# Patient Record
Sex: Male | Born: 1960 | Race: White | Hispanic: No | Marital: Married | Smoking: Former smoker
Health system: Southern US, Community
[De-identification: ages and names within clinical notes are randomized; demographics above are authoritative.]

## PROBLEM LIST (undated history)

## (undated) DIAGNOSIS — F329 Major depressive disorder, single episode, unspecified: Secondary | ICD-10-CM

## (undated) DIAGNOSIS — J309 Allergic rhinitis, unspecified: Secondary | ICD-10-CM

## (undated) DIAGNOSIS — G4730 Sleep apnea, unspecified: Secondary | ICD-10-CM

## (undated) DIAGNOSIS — M1990 Unspecified osteoarthritis, unspecified site: Secondary | ICD-10-CM

## (undated) DIAGNOSIS — K219 Gastro-esophageal reflux disease without esophagitis: Secondary | ICD-10-CM

## (undated) DIAGNOSIS — F32A Depression, unspecified: Secondary | ICD-10-CM

## (undated) DIAGNOSIS — I10 Essential (primary) hypertension: Secondary | ICD-10-CM

## (undated) DIAGNOSIS — F419 Anxiety disorder, unspecified: Secondary | ICD-10-CM

## (undated) DIAGNOSIS — E669 Obesity, unspecified: Secondary | ICD-10-CM

## (undated) DIAGNOSIS — I77810 Thoracic aortic ectasia: Principal | ICD-10-CM

## (undated) HISTORY — DX: Unspecified osteoarthritis, unspecified site: M19.90

## (undated) HISTORY — DX: Anxiety disorder, unspecified: F41.9

## (undated) HISTORY — DX: Obesity, unspecified: E66.9

## (undated) HISTORY — DX: Major depressive disorder, single episode, unspecified: F32.9

## (undated) HISTORY — DX: Depression, unspecified: F32.A

## (undated) HISTORY — DX: Sleep apnea, unspecified: G47.30

## (undated) HISTORY — DX: Thoracic aortic ectasia: I77.810

## (undated) HISTORY — PX: COLONOSCOPY: SHX174

## (undated) HISTORY — DX: Gastro-esophageal reflux disease without esophagitis: K21.9

## (undated) HISTORY — PX: HERNIA REPAIR: SHX51

## (undated) HISTORY — DX: Allergic rhinitis, unspecified: J30.9

---

## 2009-03-22 ENCOUNTER — Ambulatory Visit: Payer: Self-pay | Admitting: Otolaryngology

## 2012-12-20 ENCOUNTER — Ambulatory Visit: Payer: Self-pay | Admitting: Unknown Physician Specialty

## 2016-09-01 ENCOUNTER — Other Ambulatory Visit: Payer: Self-pay | Admitting: Internal Medicine

## 2016-09-01 DIAGNOSIS — I77810 Thoracic aortic ectasia: Secondary | ICD-10-CM

## 2016-09-11 ENCOUNTER — Ambulatory Visit
Admission: RE | Admit: 2016-09-11 | Discharge: 2016-09-11 | Disposition: A | Discharge status: Home / Self Care | Payer: BLUE CROSS/BLUE SHIELD | Source: Ambulatory Visit | Attending: Internal Medicine | Admitting: Internal Medicine

## 2016-09-11 ENCOUNTER — Other Ambulatory Visit: Payer: Self-pay | Admitting: Nurse Practitioner

## 2016-09-11 DIAGNOSIS — K5730 Diverticulosis of large intestine without perforation or abscess without bleeding: Secondary | ICD-10-CM | POA: Insufficient documentation

## 2016-09-11 DIAGNOSIS — I2510 Atherosclerotic heart disease of native coronary artery without angina pectoris: Secondary | ICD-10-CM | POA: Insufficient documentation

## 2016-09-11 DIAGNOSIS — I77810 Thoracic aortic ectasia: Secondary | ICD-10-CM | POA: Diagnosis present

## 2016-09-11 DIAGNOSIS — R1011 Right upper quadrant pain: Secondary | ICD-10-CM

## 2016-09-11 DIAGNOSIS — R12 Heartburn: Secondary | ICD-10-CM

## 2016-09-11 HISTORY — DX: Essential (primary) hypertension: I10

## 2016-09-11 MED ORDER — IOPAMIDOL (ISOVUE-370) INJECTION 76%
100.0000 mL | Freq: Once | INTRAVENOUS | Status: AC | PRN
  Administered 2016-09-11: 100 mL via INTRAVENOUS

## 2016-09-16 ENCOUNTER — Ambulatory Visit
Admission: RE | Admit: 2016-09-16 | Discharge: 2016-09-16 | Disposition: A | Discharge status: Home / Self Care | Payer: BLUE CROSS/BLUE SHIELD | Source: Ambulatory Visit | Attending: Nurse Practitioner | Admitting: Nurse Practitioner

## 2016-09-16 DIAGNOSIS — Z86010 Personal history of colonic polyps: Secondary | ICD-10-CM | POA: Diagnosis not present

## 2016-09-16 DIAGNOSIS — K760 Fatty (change of) liver, not elsewhere classified: Secondary | ICD-10-CM | POA: Insufficient documentation

## 2016-09-16 DIAGNOSIS — R12 Heartburn: Secondary | ICD-10-CM | POA: Diagnosis present

## 2016-09-16 DIAGNOSIS — R1011 Right upper quadrant pain: Secondary | ICD-10-CM | POA: Insufficient documentation

## 2016-09-17 ENCOUNTER — Other Ambulatory Visit: Payer: Self-pay | Admitting: Nurse Practitioner

## 2016-09-17 DIAGNOSIS — R1011 Right upper quadrant pain: Secondary | ICD-10-CM

## 2016-09-17 DIAGNOSIS — R12 Heartburn: Secondary | ICD-10-CM

## 2016-10-08 ENCOUNTER — Other Ambulatory Visit: Payer: Self-pay

## 2016-10-08 ENCOUNTER — Institutional Professional Consult (permissible substitution) (INDEPENDENT_AMBULATORY_CARE_PROVIDER_SITE_OTHER): Payer: BLUE CROSS/BLUE SHIELD | Admitting: Cardiothoracic Surgery

## 2016-10-08 VITALS — BP 112/71 | HR 87 | Resp 20 | Ht 66.0 in | Wt 265.0 lb

## 2016-10-08 DIAGNOSIS — I77810 Thoracic aortic ectasia: Secondary | ICD-10-CM | POA: Diagnosis not present

## 2016-10-08 HISTORY — DX: Thoracic aortic ectasia: I77.810

## 2016-10-08 NOTE — Patient Instructions (Signed)

## 2016-10-08 NOTE — Progress Notes (Signed)
[address]       [place] [address]             [phone]                    Joseph Hatfield [place] Medical Record #[id number] Date of Birth: [birth year]  Referring: Joseph Ferrier, MD Primary Care: Joseph Ferrier, MD  Chief Complaint:    Chief Complaint  Patient presents with  . Thoracic Aortic Aneurysm    Surgical eval, CTA C/A/P 09/11/16, ECHO 08/28/16 @ [facility]     History of Present Illness:    Joseph Hatfield 56 y.o. male is seen in the office  today for The incidental finding of a dilated descending aorta to 4.5 cm. Patient has no previous cardiac history. Patient was undergoing a echo stress test several weeks ago and was incidentally noted on echo to have a 4.5 cm ascending aorta, the report does not define in detail if she he has an aortic bicuspid valve or a trileaflet aortic valve.   The patient does have a history of hypertension, obesity and sleep apnea.   The patient has no family history of aortic aneurysm or aortic dissection, however it should be noted that his father died suddenly of unknown cause at age 23 in 2001-06-13, his mother died of COPD in 29 at age of 102 he has a brother and sister with no medical problems 1 uncles had myocardial infarction.    Current Activity/ Functional Status:  Patient is independent with mobility/ambulation, transfers, ADL's, IADL's.   Zubrod Score: At the time of surgery this patient's most appropriate activity status/level should be described as:     0    Normal activity, no symptoms     1    Restricted in physical strenuous activity but ambulatory, able to do out light work     2    Ambulatory and capable of self care, unable to do work activities, up and about               >50 % of waking hours                                  3    Only limited self care, in bed greater than 50% of waking hours     4    Completely disabled, no self care, confined to bed or chair     5    Moribund   Past  Medical History:  Diagnosis Date  . Allergic rhinitis   . Anxiety   . Depression   . Dilated aortic root (HCC) 10/08/2016   4.5 cm CTA Chest 09/11/2016  . GERD (gastroesophageal reflux disease)   . Hypertension   . Obesity   . Osteoarthritis    knees  . Sleep apnea    on CPAP    Past Surgical History:  Procedure Laterality Date  . COLONOSCOPY     adenomatous polyps: [facility] 12/2015  . HERNIA REPAIR     left inguinal ; Dr Michela Pitcher 14-Jun-2003    Family History  Problem Relation Age of Onset  . GER disease Mother   . Colon polyps Mother   . Hypertension Father   . Heart attack Father     Social History   Social History  . Marital status: Married    Spouse  name: N/A  . Number of children: N/A  . Years of education: N/A   Occupational History  . Not on file.   Social History Main Topics  . Smoking status: Former Smoker    Types: Cigarettes  . Smokeless tobacco: Former Neurosurgeon    Types: Chew  . Alcohol use Yes  . Drug use: No  . Sexual activity: Yes    Partners: Female    Birth control/ protection: None   Other Topics Concern  . Not on file   Social History Narrative  . No narrative on file    History  Smoking Status  . Former Smoker  . Types: Cigarettes  Smokeless Tobacco  . Former Neurosurgeon  . Types: Chew    History  Alcohol Use  . Yes     No Known Allergies  Current Outpatient Prescriptions  Medication Sig Dispense Refill  . busPIRone (BUSPAR) 15 MG tablet Take 1/2-1 tablet by mouth twice a day as directed for anxiety    . citalopram (CELEXA) 40 MG tablet Take 1 tablet by mouth once a day    . metoprolol succinate (TOPROL-XL) 25 MG 24 hr tablet Take by mouth.    . pantoprazole (PROTONIX) 40 MG tablet Take by mouth.    . triamcinolone cream (KENALOG) 0.5 % Apply topically.     No current facility-administered medications for this visit.     Pertinent items are noted in HPI.   Review of Systems:     Cardiac Review of Systems: Y or N  Chest Pain [ y   ]   Resting SOB [ n  ] Exertional SOB  [ y ]  Orthopnea [ n ]   Pedal Edema [n   ]    Palpitations [ n ] Syncope  [n  ]   Presyncope [  n ]  General Review of Systems: [Y] = yes [  ]=no Constitional: recent weight change [  ];  Wt loss over the last 3 months [   ] anorexia [  ]; fatigue [  ]; nausea [  ]; night sweats [  ]; fever [  ]; or chills [  ];          Dental: poor dentition[  ]; Last Dentist visit:   Eye : blurred vision [  ]; diplopia [   ]; vision changes [  ];  Amaurosis fugax[  ]; Resp: cough [n  ];  wheezing[ n ];  hemoptysis[n  ]; shortness of breath[y  ]; paroxysmal nocturnal dyspnea[  ]; dyspnea on exertion[  ]; or orthopnea[  ];  GI:  gallstones[  ], vomiting[  ];  dysphagia[  ]; melena[  ];  hematochezia [  ]; heartburn[  ];   Hx of  Colonoscopy[  ]; GU: kidney stones [  ]; hematuria[n  ];   dysuria [  ];  nocturia[  ];  history of     obstruction [  ]; urinary frequency [  ]             Skin: rash, swelling[  ];, hair loss[  ];  peripheral edema[  ];  or itching[  ]; Musculosketetal: myalgias[  ];  joint swelling[  ];  joint erythema[  ];  joint pain[  ];  back pain[  ];  Heme/Lymph: bruising[n  ];  bleeding[  ];  anemia[  ];  Neuro: TIA[n  ];  headaches[  ];  stroke[  ];  vertigo[  ];  seizures[  ];  paresthesias[  ];  difficulty walking[  ];  Psych:depression[  ]; anxiety[  ];  Endocrine: diabetes[ n ];  thyroid dysfunction[n  ];  Immunizations: Flu up to date [y]; Pneumococcal up to date [ y ];  Other:  Physical Exam: BP 112/71 (BP Location: Right Arm, Cuff Size: Normal)   Pulse 87   Resp 20   Ht 5\' 6"  (1.676 m)   Wt 265 lb (120.2 kg)   SpO2 95% Comment: RA  BMI 42.77 kg/m   PHYSICAL EXAMINATION: General appearance: alert, cooperative, appears older than stated age and no distress Head: Normocephalic, without obvious abnormality, atraumatic Neck: no adenopathy, no carotid bruit, no JVD, supple, symmetrical, trachea midline and thyroid not enlarged, symmetric, no  tenderness/mass/nodules Lymph nodes: Cervical, supraclavicular, and axillary nodes normal. Resp: clear to auscultation bilaterally Back: symmetric, no curvature. ROM normal. No CVA tenderness. Cardio: regular rate and rhythm, S1, S2 normal, no murmur, click, rub or gallop and No murmur GI: soft, non-tender; bowel sounds normal; no masses,  no organomegaly Extremities: extremities normal, atraumatic, no cyanosis or edema Neurologic: Grossly normal  Diagnostic Studies & Laboratory data:     Recent Radiology Findings:   Ct Angio Chest/abd/pel For Dissection W And/or W/wo  Result Date: 09/11/2016 CLINICAL DATA:  Dilated aortic root. EXAM: CT ANGIOGRAPHY CHEST, ABDOMEN AND PELVIS TECHNIQUE: Multidetector CT imaging through the chest, abdomen and pelvis was performed using the standard protocol during bolus administration of intravenous contrast. Multiplanar reconstructed images and MIPs were obtained and reviewed to evaluate the vascular anatomy. CONTRAST:  100 mL Isovue 370 COMPARISON:  None. FINDINGS: CTA CHEST FINDINGS Cardiovascular: Aortic root at the sinuses of Valsalva measures up to 4.6 cm. The mid ascending thoracic aorta measures 4.5 cm. Proximal aortic arch measures 3.7 cm. Great vessels are patent. Posterior aortic arch measures 3.0 cm. Proximal descending thoracic aorta measures 2.7 cm. The mid descending thoracic aorta measures 2.8 cm. Distal descending thoracic aorta measures 2.5 cm. Negative for an aortic dissection. No significant atherosclerotic disease in the thoracic aorta or great vessels. There are coronary artery calcifications. Heart size is within limits. Main pulmonary arteries are patent. Mediastinum/Nodes: No significant mediastinal or hilar lymphadenopathy. Visualized thyroid tissue is unremarkable. Normal appearance of the esophagus. Lungs/Pleura: Trachea and mainstem bronchi are patent. 2 mm pleural-based nodular density in the left lower lobe on sequence 9 image 42. No  significant airspace disease or lung consolidation. No pleural effusions. Musculoskeletal: No acute bone abnormality. Review of the MIP images confirms the above findings. CTA ABDOMEN AND PELVIS FINDINGS VASCULAR Aorta: Normal caliber of the abdominal aorta with mild atherosclerotic disease in the infrarenal abdominal aorta. Negative for an aortic dissection. Celiac: Patent without evidence of aneurysm, dissection, vasculitis or significant stenosis. SMA: Patent without evidence of aneurysm, dissection, vasculitis or significant stenosis. Incidentally, there is a replaced right hepatic artery which is a normal variant. Renals: Both renal arteries are patent without evidence of aneurysm, dissection, vasculitis, fibromuscular dysplasia or significant stenosis. IMA: Patent without evidence of aneurysm, dissection, vasculitis or significant stenosis. Inflow: Patent without evidence of aneurysm, dissection, vasculitis or significant stenosis. Veins: No obvious venous abnormality within the limitations of this arterial phase study. Review of the MIP images confirms the above findings. NON-VASCULAR Hepatobiliary: Subtle low-density structure along the anterior right hepatic lobe is too small to definitively characterize. Normal appearance of the gallbladder. No biliary dilatation. Pancreas: Normal appearance of the pancreas without inflammation or duct dilatation. Spleen: Normal appearance of spleen without enlargement. Adrenals/Urinary Tract: Normal  adrenal glands. Probable 0.6 cm cyst in the posterolateral right kidney. No hydronephrosis. Urinary bladder is unremarkable. Stomach/Bowel: Colonic diverticulosis in the sigmoid colon without acute inflammatory changes. Appendix is normal. No evidence for bowel dilatation or focal bowel wall thickening. Normal appearance stomach and duodenum. Lymphatic: No enlarged abdominal or pelvic lymph nodes. Reproductive: Calcifications in prostate. Other: Changes compatible with left  inguinal hernia surgery. No free fluid. No free air. Small umbilical hernia containing fat. Musculoskeletal: Disc space loss at L5-S1 with facet disease at L5-S1. Review of the MIP images confirms the above findings. 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 The ascending thoracic aorta measures up to 4.5 cm. Recommend semi-annual imaging followup by CTA or MRA and referral to cardiothoracic surgery if not already obtained. This recommendation follows 2010 ACCF/AHA/AATS/ACR/ASA/SCA/SCAI/SIR/STS/SVM Guidelines for the Diagnosis and Management of Patients With Thoracic Aortic Disease. Circulation. 2010; 121: Z610-R604 Coronary artery calcifications. Punctate pleural-based nodule in the left lower lobe is nonspecific and probably an incidental finding. This could be related to atelectasis or scarring. No follow-up needed if patient is low-risk (and has no known or suspected primary neoplasm). Non-contrast chest CT can be considered in 12 months if patient is high-risk. This recommendation follows the consensus statement: Guidelines for Management of Incidental Pulmonary Nodules Detected on CT Images: From the Fleischner Society 2017; Radiology 2017; 284:228-243. No acute abnormality in the chest, abdomen or pelvis. Colonic diverticulosis without acute inflammation. Electronically Signed   By: Richarda Overlie M.D.   On: 09/11/2016 15:49   US Abdomen Limited Ruq  Result Date: 09/16/2016 CLINICAL DATA:  Postprandial right upper quadrant pain EXAM: ULTRASOUND ABDOMEN LIMITED RIGHT UPPER QUADRANT COMPARISON:  09/11/2016 FINDINGS: Gallbladder: No gallstones or wall thickening visualized. No sonographic Murphy sign noted by sonographer. Common bile duct: Diameter: 2.6 mm Liver: Diffuse increased echogenicity compatible with hepatic steatosis. No focal hepatic abnormality or intrahepatic biliary dilatation. Portal vein is patent on color Doppler imaging with normal direction of blood flow towards the  liver. IMPRESSION: Negative for gallstones or biliary dilatation. Hepatic steatosis Electronically Signed   By: Judie Petit.  Shick M.D.   On: 09/16/2016 13:39     I have independently reviewed the above radiology studies  and reviewed the findings with the patient.  STRESS ECHO:  INTERNAL MEDICINE DEPARTMENT [place], [place] [id number] [facility] Acct #: [id number]  [address] [place] [address] Date: 08/28/2016 03:16 PM  Adult Male Age: 15 yrs  ECHOCARDIOGRAM REPORT Outpatient STUDY:Stress EchoTAPE: KC::KCWI  ECHO:Yes DOPPLER:YesFILE:0000-000-000 MD1:KLEIN, BERT JACK COLOR:YesCONTRAST:NoMACHINE:PhilipsHeight: 66 in RV BIOPSY:No 3D:No SOUND QLTY:Moderate Weight: 121 lb  MEDIUM:None BSA: 1.6 m2  ___________________________________________________________________________________________  HISTORY:Chest pain REASON:Assess, LV function Indication:Chest pain at rest, unspecified [R07.9 (ICD-10-CM)]  ___________________________________________________________________________________________ STRESS ECHOCARDIOGRAPHY   Protocol:Treadmill (Bruce) Drugs:None Target Heart Rate:141 bpmMaximum Predicted Heart Rate: 166 bpm  +-------------------+-------------------------+-------------------------+------------+  Stage  Duration (mm:ss) Heart Rate (bpm) BP  +-------------------+-------------------------+-------------------------+------------+ RESTING  136  122/78   +-------------------+-------------------------+-------------------------+------------+ EXERCISE  4:39 136  / +-------------------+-------------------------+-------------------------+------------+ RECOVERY  3:4382  181/90  +-------------------+-------------------------+-------------------------+------------+  Stress Duration:4:39 mm:ss Max Stress H.R.:136 bpmTarget Heart Rate Achieved: No Maximum workload of 7.00 METS was achieved during exercise  ___________________________________________________________________________________________  WALL SEGMENT CHANGES  RestStress Anterior Septum Basal:NormalHyperkinetic ZHY:[id number]  Apical:NormalHyperkinetic  Anterior Wall Basal:NormalHyperkinetic GMW:[id number]  Apical:NormalHyperkinetic   Lateral Wall Basal:NormalHyperkinetic ZDG:[id number]  Apical:NormalHyperkinetic   Posterior Wall Basal:NormalHyperkinetic AYT:[id number]  Inferior Wall Basal:NormalHyperkinetic RKY:[id number]  Apical:NormalHyperkinetic  Inferior Septum Basal:NormalHyperkinetic VPX:[id number]   Resting EF:>55% (Est.) Stress EF: >55% (Est.)  ___________________________________________________________________________________________  ADDITIONAL FINDINGS   ___________________________________________________________________________________________  STRESS ECG RESULTS   ECG  Results:Normal  ___________________________________________________________________________________________ ECHOCARDIOGRAPHIC DESCRIPTIONS  LEFT VENTRICLE Size:Normal  Contraction:Normal  LV Masses:No Masses  WUJ:WJXB Dias.FxClass:Normal  RIGHT VENTRICLE Size:Normal Free Wall:Normal  Contraction:Normal RV Masses:No mass  PERICARDIUM  Fluid:No effusion   _______________________________________________________________________________________ DOPPLER ECHO and OTHER SPECIAL PROCEDURES  Aortic:MILD ARNo AS 137.3 cm/sec peak vel7.5 mmHg peak grad 5.1 mmHg mean grad 2.3 cm^2 by DOPPLER   Mitral:MILD MRNo MS MV Inflow E Vel=nm*MV Annulus E'Vel=nm* E/E'Ratio=nm*  Tricuspid:MILD TRNo TS 259.7 cm/sec peak TR vel  Pulmonary:MILD PRNo PS    ___________________________________________________________________________________________ ECHOCARDIOGRAPHIC MEASUREMENTS 2D DIMENSIONS AORTA ValuesNormal RangeMAIN PAValuesNormal Range Annulus:nm* [2.3 - 2.9]PA Main:nm* [1.5 - 2.1] Aorta Sin:nm* [3.1 - 3.7] RIGHT VENTRICLE ST Junction:nm* [2.6 - 3.2]RV Base:nm* [ < 4.2] Asc.Aorta:nm* [2.6 - 3.4] RV Mid:nm* [ < 3.5]  LEFT VENTRICLERV Length:nm* [ < 8.6] LVIDd:5.3 cm[4.2 - 5.9] INFERIOR VENA CAVA LVIDs:2.8 cmMax. IVC:nm* [ <= 2.1]  FS:46.7 %[> 25]Min. IVC:nm* SWT:nm* [0.6 - 1.0]  ------------------ PWT:nm* [0.6 - 1.0] nm* - not measured  LEFT ATRIUM LA Diam:nm* [3.0 - 4.0] LA A4C Area:nm* [ < 20] LA Volume:nm* [18 - 58]   ___________________________________________________________________________________________ INTERPRETATION Normal Stress Echocardiogram NORMAL RIGHT VENTRICULAR SYSTOLIC FUNCTION MILD VALVULAR REGURGITATION (See above) NO VALVULAR STENOSIS NOTED Ascending proximal aorta 4.6cm   ___________________________________________________________________________________________ Electronically signed by: Danella Penton, MD on 08/29/2016 01:37 PM Performed By: Rayetta Humphrey, RCS Ordering Physician: Daniel Nones  ___________________________________________________________________________________________  Other Result Information  Interface, Text Results In - 08/29/2016  1:38 PM EDT       Version 2                 INTERNAL MEDICINE DEPARTMENT               [place], [place]                       [facility]                      [id number]                   [facility]                 Acct #: [id number]        [address] [place],  [place] [address]       Date: 08/28/2016 03:16 PM                                                            Adult Male Age: 64 yrs                    ECHOCARDIOGRAM REPORT                   Outpatient           STUDY:Stress Echo        TAPE:                   KC::KCWI            ECHO:Yes   DOPPLER:Yes  FILE:0000-000-000       MD1:  Teena Dunk  COLOR:Yes  CONTRAST:No      MACHINE:Philips      Height: 66 in       RV BIOPSY:No         3D:No   SOUND QLTY:Moderate     Weight: 121 lb          MEDIUM:None                                       BSA: 1.6 m2  ___________________________________________________________________________________________        HISTORY:Chest pain          REASON:Assess, LV function     Indication:Chest pain at rest, unspecified [R07.9 (ICD-10-CM)]  ___________________________________________________________________________________________ STRESS ECHOCARDIOGRAPHY           Protocol:Treadmill (Bruce)             Drugs:None Target Heart Rate:141 bpm        Maximum Predicted Heart Rate: 166 bpm  +-------------------+-------------------------+-------------------------+------------+        Stage            Duration (mm:ss)         Heart Rate (bpm)         BP      +-------------------+-------------------------+-------------------------+------------+       RESTING                                          136              122/78    +-------------------+-------------------------+-------------------------+------------+       EXERCISE                4:39                     136                /       +-------------------+-------------------------+-------------------------+------------+       RECOVERY                3:43                      82              181/90    +-------------------+-------------------------+-------------------------+------------+    Stress Duration:4:39 mm:ss   Max Stress H.R.:136 bpm        Target Heart Rate Achieved: No Maximum workload of 7.00 METS was achieved during exercise  ___________________________________________________________________________________________  WALL SEGMENT CHANGES                        Rest          Stress Anterior Septum Basal:Normal        Hyperkinetic                   ZOX:WRUEAV        Hyperkinetic                Apical:Normal        Hyperkinetic    Anterior Wall Basal:Normal        Hyperkinetic                   WUJ:WJXBJY        Hyperkinetic  Apical:Normal        Hyperkinetic     Lateral Wall Basal:Normal        Hyperkinetic                   RUE:AVWUJW        Hyperkinetic                Apical:Normal        Hyperkinetic   Posterior Wall  Basal:Normal        Hyperkinetic                   JXB:JYNWGN        Hyperkinetic    Inferior Wall Basal:Normal        Hyperkinetic                   FAO:ZHYQMV        Hyperkinetic                Apical:Normal        Hyperkinetic  Inferior Septum Basal:Normal        Hyperkinetic                   HQI:ONGEXB        Hyperkinetic             Resting EF:>55% (Est.)   Stress EF: >55% (Est.)  ___________________________________________________________________________________________  ADDITIONAL FINDINGS   ___________________________________________________________________________________________  STRESS ECG RESULTS                                               ECG Results:Normal  ___________________________________________________________________________________________ ECHOCARDIOGRAPHIC DESCRIPTIONS  LEFT VENTRICLE         Size:Normal  Contraction:Normal    LV Masses:No Masses          MWU:XLKG Dias.FxClass:Normal  RIGHT VENTRICLE         Size:Normal               Free Wall:Normal  Contraction:Normal               RV Masses:No mass  PERICARDIUM        Fluid:No effusion   _______________________________________________________________________________________ DOPPLER ECHO and OTHER SPECIAL PROCEDURES    Aortic:MILD AR                    No AS           137.3 cm/sec peak vel      7.5 mmHg peak grad           5.1 mmHg mean grad         2.3 cm^2 by DOPPLER     Mitral:MILD MR                    No MS           MV Inflow E Vel=nm*        MV Annulus E'Vel=nm*           E/E'Ratio=nm*  Tricuspid:MILD TR                    No TS           259.7 cm/sec peak TR vel  Pulmonary:MILD PR  No PS    ___________________________________________________________________________________________ ECHOCARDIOGRAPHIC MEASUREMENTS 2D DIMENSIONS AORTA             Values      Normal Range      MAIN PA          Values      Normal Range           Annulus:  nm*       [2.3 -  2.9]                PA Main:  nm*       [1.5 - 2.1]         Aorta Sin:  nm*       [3.1 - 3.7]       RIGHT VENTRICLE       ST Junction:  nm*       [2.6 - 3.2]                RV Base:  nm*       [ < 4.2]         Asc.Aorta:  nm*       [2.6 - 3.4]                 RV Mid:  nm*       [ < 3.5]  LEFT VENTRICLE                                        RV Length:  nm*       [ < 8.6]             LVIDd:  5.3 cm    [4.2 - 5.9]       INFERIOR VENA CAVA             LVIDs:  2.8 cm                              Max. IVC:  nm*       [ <= 2.1]                FS:  46.7 %    [> 25]                    Min. IVC:  nm*               SWT:  nm*       [0.6 - 1.0]                   ------------------               PWT:  nm*       [0.6 - 1.0]                   nm* - not measured  LEFT ATRIUM           LA Diam:  nm*       [3.0 - 4.0]       LA A4C Area:  nm*       [ < 20]         LA Volume:  nm*       [18 - 58]   ___________________________________________________________________________________________ INTERPRETATION Normal Stress Echocardiogram NORMAL RIGHT VENTRICULAR SYSTOLIC FUNCTION MILD VALVULAR REGURGITATION (See above) NO  VALVULAR STENOSIS NOTED Ascending proximal aorta 4.6cm   ___________________________________________________________________________________________ Electronically signed by: Danella Penton, MD on 08/29/2016 01:37 PM             Performed By: Rayetta Humphrey, RCS       Ordering Physician: Daniel Nones     Recent Lab Findings: No results found for: WBC, HGB, HCT, PLT, GLUCOSE, CHOL, TRIG, HDL, LDLDIRECT, LDLCALC, ALT, AST, NA, K, CL, CREATININE, BUN, CO2, TSH, INR, GLUF, HGBA1C  Aortic Size Index=     4.6     /Body surface area is 2.37 meters squared. = 1.94  < 2.75 cm/m2      4% risk per year 2.75 to 4.25          8% risk per year > 4.25 cm/m2    20% risk per year    Assessment / Plan:    1/Dilation of ascending aorta 4.6 cm 2/MILD AI- Noted on stress echo, the status of the  aortic valve bicuspid versus trial leaflet is not        described on the echo report   3/Obesity, BMI > 40    Incidental Lung Nodule - 2 mm pleural-based nodular density in the left lower lobe   I agree with the patient and his wife the diagnosis of dilated ascending aorta 4.6 cm. At this point I recommended that he proceed with good blood pressure control, control of his sleep apnea, and aggressive weight loss with his BMI of 45. We'll obtain a follow-up CT gated in 6 months  Patient was cautioned about, heavy lifting, straining, was cautioned not to use Cipro. Signs and symptoms of aortic dissection were discussed with him.   Patient was warned about not using Cipro and similar antibiotics. Recent studies have raised concern that fluoroquinolone antibiotics could be associated with an increased risk of aortic aneurysm Fluoroquinolones have non-antimicrobial properties that might jeopardise the integrity of the extracellular matrix of the vascular wall In a  propensity score matched cohort study in [place], there was a 66% increased rate of aortic aneurysm or dissection associated with oral fluoroquinolone use, compared with amoxicillin use, within a 60 day risk period from start of treatment  I  spent 40 minutes counseling the patient face to face and 50% or more the  time was spent in counseling and coordination of care. The total time spent in the appointment was 60 minutes.  Delight Ovens MD      [address] [place] [place] [address] Office [phone]   Beeper [phone]  10/08/2016 4:39 PM

## 2016-10-09 ENCOUNTER — Ambulatory Visit: Payer: BLUE CROSS/BLUE SHIELD

## 2016-12-01 ENCOUNTER — Ambulatory Visit: Payer: BLUE CROSS/BLUE SHIELD | Admitting: Anesthesiology

## 2016-12-01 ENCOUNTER — Encounter
Admission: RE | Disposition: A | Discharge status: Home / Self Care | Payer: Self-pay | Source: Ambulatory Visit | Attending: Unknown Physician Specialty

## 2016-12-01 ENCOUNTER — Ambulatory Visit
Admission: RE | Admit: 2016-12-01 | Discharge: 2016-12-01 | Disposition: A | Discharge status: Home / Self Care | Payer: BLUE CROSS/BLUE SHIELD | Source: Ambulatory Visit | Attending: Unknown Physician Specialty | Admitting: Unknown Physician Specialty

## 2016-12-01 ENCOUNTER — Encounter: Payer: Self-pay | Admitting: Anesthesiology

## 2016-12-01 DIAGNOSIS — K2950 Unspecified chronic gastritis without bleeding: Secondary | ICD-10-CM | POA: Insufficient documentation

## 2016-12-01 DIAGNOSIS — K219 Gastro-esophageal reflux disease without esophagitis: Secondary | ICD-10-CM | POA: Diagnosis not present

## 2016-12-01 DIAGNOSIS — G4730 Sleep apnea, unspecified: Secondary | ICD-10-CM | POA: Diagnosis not present

## 2016-12-01 DIAGNOSIS — I10 Essential (primary) hypertension: Secondary | ICD-10-CM | POA: Insufficient documentation

## 2016-12-01 DIAGNOSIS — F329 Major depressive disorder, single episode, unspecified: Secondary | ICD-10-CM | POA: Insufficient documentation

## 2016-12-01 DIAGNOSIS — K5730 Diverticulosis of large intestine without perforation or abscess without bleeding: Secondary | ICD-10-CM | POA: Insufficient documentation

## 2016-12-01 DIAGNOSIS — F419 Anxiety disorder, unspecified: Secondary | ICD-10-CM | POA: Diagnosis not present

## 2016-12-01 DIAGNOSIS — Z79899 Other long term (current) drug therapy: Secondary | ICD-10-CM | POA: Diagnosis not present

## 2016-12-01 DIAGNOSIS — Z87891 Personal history of nicotine dependence: Secondary | ICD-10-CM | POA: Diagnosis not present

## 2016-12-01 DIAGNOSIS — Z86010 Personal history of colonic polyps: Secondary | ICD-10-CM | POA: Diagnosis not present

## 2016-12-01 DIAGNOSIS — Z1211 Encounter for screening for malignant neoplasm of colon: Secondary | ICD-10-CM | POA: Insufficient documentation

## 2016-12-01 DIAGNOSIS — Z8371 Family history of colonic polyps: Secondary | ICD-10-CM | POA: Insufficient documentation

## 2016-12-01 DIAGNOSIS — E669 Obesity, unspecified: Secondary | ICD-10-CM | POA: Insufficient documentation

## 2016-12-01 DIAGNOSIS — K640 First degree hemorrhoids: Secondary | ICD-10-CM | POA: Diagnosis not present

## 2016-12-01 DIAGNOSIS — R12 Heartburn: Secondary | ICD-10-CM | POA: Diagnosis present

## 2016-12-01 DIAGNOSIS — Z6839 Body mass index (BMI) 39.0-39.9, adult: Secondary | ICD-10-CM | POA: Insufficient documentation

## 2016-12-01 HISTORY — PX: COLONOSCOPY WITH PROPOFOL: SHX5780

## 2016-12-01 HISTORY — PX: ESOPHAGOGASTRODUODENOSCOPY (EGD) WITH PROPOFOL: SHX5813

## 2016-12-01 SURGERY — COLONOSCOPY WITH PROPOFOL
Anesthesia: General

## 2016-12-01 MED ORDER — SODIUM CHLORIDE 0.9 % IV SOLN: INTRAVENOUS | Status: DC

## 2016-12-01 MED ORDER — PROPOFOL 10 MG/ML IV BOLUS
INTRAVENOUS | Status: AC
  Filled 2016-12-01: qty 20

## 2016-12-01 MED ORDER — PROPOFOL 500 MG/50ML IV EMUL
INTRAVENOUS | Status: DC | PRN
  Administered 2016-12-01: 120 ug/kg/min via INTRAVENOUS

## 2016-12-01 MED ORDER — MIDAZOLAM HCL 2 MG/2ML IJ SOLN
INTRAMUSCULAR | Status: AC
  Filled 2016-12-01: qty 2

## 2016-12-01 MED ORDER — FENTANYL CITRATE (PF) 100 MCG/2ML IJ SOLN
INTRAMUSCULAR | Status: DC | PRN
  Administered 2016-12-01: 50 ug via INTRAVENOUS

## 2016-12-01 MED ORDER — FENTANYL CITRATE (PF) 100 MCG/2ML IJ SOLN
INTRAMUSCULAR | Status: AC
Start: 2016-12-01 — End: 2016-12-01
  Filled 2016-12-01: qty 2

## 2016-12-01 MED ORDER — SODIUM CHLORIDE 0.9 % IV SOLN
INTRAVENOUS | Status: DC
  Administered 2016-12-01: 13:00:00 via INTRAVENOUS

## 2016-12-01 MED ORDER — PROPOFOL 500 MG/50ML IV EMUL
INTRAVENOUS | Status: AC
  Filled 2016-12-01: qty 50

## 2016-12-01 MED ORDER — MIDAZOLAM HCL 2 MG/2ML IJ SOLN
INTRAMUSCULAR | Status: DC | PRN
  Administered 2016-12-01: 2 mg via INTRAVENOUS

## 2016-12-01 NOTE — Op Note (Signed)
[facility] Gastroenterology Patient Name: Orvilla Fusommy Schomer Procedure Date: 12/01/2016 1:13 PM MRN: [id number] Account #: [id number] Date of Birth: 1960/08/29 Admit Type: Outpatient Age: 56 [facility] Gender: Male Note Status: Finalized Procedure:            Colonoscopy Indications:          High risk colon cancer surveillance: Personal history                        of colonic polyps Providers:            Scot Junobert T. Luanne Krzyzanowski, MD Referring MD:         Daniel NonesBert Klein, MD (Referring MD) Medicines:            Propofol per Anesthesia Complications:        No immediate complications. Procedure:            Pre-Anesthesia Assessment:                       - After reviewing the risks and benefits, the patient                        was deemed in satisfactory condition to undergo the                        procedure.                       After obtaining informed consent, the colonoscope was                        passed under direct vision. Throughout the procedure,                        the patient's blood pressure, pulse, and oxygen                        saturations were monitored continuously. The                        Colonoscope was introduced through the anus and                        advanced to the the cecum, identified by appendiceal                        orifice and ileocecal valve. The colonoscopy was                        performed without difficulty. The patient tolerated the                        procedure well. The quality of the bowel preparation                        was good. Findings:      A few small-mouthed diverticula were found in the sigmoid colon and       descending colon.      Internal hemorrhoids were found during endoscopy. The hemorrhoids were       small and Grade I (internal hemorrhoids that do not prolapse).  The exam was otherwise without abnormality. Impression:           - Diverticulosis in the sigmoid colon and in the                         descending colon.                       - Internal hemorrhoids.                       - The examination was otherwise normal.                       - No specimens collected. Recommendation:       - Repeat colonoscopy in 5 years for surveillance. Procedure Code(s):    --- Professional ---                       [id number], Colonoscopy, flexible; diagnostic, including                        collection of specimen(s) by brushing or washing, when                        performed (separate procedure) Diagnosis Code(s):    --- Professional ---                       Z86.010, Personal history of colonic polyps                       K64.0, First degree hemorrhoids                       K57.30, Diverticulosis of large intestine without                        perforation or abscess without bleeding CPT copyright 2016 [employer]. All rights reserved. The codes documented in this report are preliminary and upon coder review may  be revised to meet current compliance requirements. Scot Junobert T Nadiah Corbit, MD 12/01/2016 1:46:04 PM This report has been signed electronically. Number of Addenda: 0 Note Initiated On: 12/01/2016 1:13 PM Scope Withdrawal Time: 0 hours 9 minutes 44 seconds  Total Procedure Duration: 0 hours 16 minutes 11 seconds       [facility]

## 2016-12-01 NOTE — Anesthesia Procedure Notes (Signed)
Performed by: Cook-Martin, Prisma Decarlo Pre-anesthesia Checklist: Patient identified, Emergency Drugs available, Suction available, Patient being monitored and Timeout performed Patient Re-evaluated:Patient Re-evaluated prior to induction Oxygen Delivery Method: Nasal cannula Preoxygenation: Pre-oxygenation with 100% oxygen Induction Type: IV induction Airway Equipment and Method: Bite block Placement Confirmation: positive ETCO2 and CO2 detector       

## 2016-12-01 NOTE — H&P (Signed)
Primary Care Physician:  Lynnea FerrierKlein, Bert J III, MD Primary Gastroenterologist:  Dr. Mechele CollinElliott  Pre-Procedure History & Physical: HPI:  Joseph Hatfield is a 56 y.o. male is here for an endoscopy and colonoscopy.   Past Medical History:  Diagnosis Date  . Allergic rhinitis   . Anxiety   . Depression   . Dilated aortic root (HCC) 10/08/2016   4.5 cm CTA Chest 09/11/2016  . GERD (gastroesophageal reflux disease)   . Hypertension   . Obesity   . Osteoarthritis    knees  . Sleep apnea    on CPAP    Past Surgical History:  Procedure Laterality Date  . COLONOSCOPY     adenomatous polyps: [facility] 12/2015  . HERNIA REPAIR     left inguinal ; Dr Michela PitcherEly 04/2003    Prior to Admission medications   Medication Sig Start Date End Date Taking? Authorizing Provider  metoprolol succinate (TOPROL-XL) 25 MG 24 hr tablet Take by mouth daily.  08/22/16 08/22/17 Yes [provider]  busPIRone (BUSPAR) 15 MG tablet Take 1/2-1 tablet by mouth twice a day as directed for anxiety 09/03/16   [provider]  citalopram (CELEXA) 40 MG tablet Take 1 tablet by mouth once a day 02/04/16   [provider]  pantoprazole (PROTONIX) 40 MG tablet Take by mouth 2 (two) times daily.  08/22/16 08/22/17  [provider]    Allergies as of 11/27/2016  . (No Known Allergies)    Family History  Problem Relation Age of Onset  . GER disease Mother   . Colon polyps Mother   . Hypertension Father   . Heart attack Father     Social History   Socioeconomic History  . Marital status: Married    Spouse name: Not on file  . Number of children: Not on file  . Years of education: Not on file  . Highest education level: Not on file  Social Needs  . Financial resource strain: Not on file  . Food insecurity - worry: Not on file  . Food insecurity - inability: Not on file  . Transportation needs - medical: Not on file  . Transportation needs - non-medical: Not on file  Occupational History  . Not on  file  Tobacco Use  . Smoking status: Former Smoker    Types: Cigarettes  . Smokeless tobacco: Former NeurosurgeonUser    Types: Chew  Substance and Sexual Activity  . Alcohol use: Yes  . Drug use: No  . Sexual activity: Yes    Partners: Female    Birth control/protection: None  Other Topics Concern  . Not on file  Social History Narrative  . Not on file    Review of Systems: See HPI, otherwise negative ROS  Physical Exam: BP (!) 141/91   Pulse 65   Temp 98.3 F (36.8 C) (Tympanic)   Resp 16   Ht 5\' 6"  (1.676 m)   Wt 112 kg (247 lb)   SpO2 98%   BMI 39.87 kg/m  General:   Alert,  pleasant and cooperative in NAD Head:  Normocephalic and atraumatic. Neck:  Supple; no masses or thyromegaly. Lungs:  Clear throughout to auscultation.    Heart:  Regular rate and rhythm. Abdomen:  Soft, nontender and nondistended. Normal bowel sounds, without guarding, and without rebound.   Neurologic:  Alert and  oriented x4;  grossly normal neurologically.  Impression/Plan: Lexus W Wantz is here for an endoscopy [facility] and chronic heartburn.  This has gotten better since was placed on Protonix.  Risks, benefits, limitations, and alternatives regarding  endoscopy and colonoscopy have been reviewed with the patient.  Questions have been answered.  All parties agreeable.   [name], Ronasia Isola, MD  12/01/2016, 1:09 PM

## 2016-12-01 NOTE — Transfer of Care (Signed)
Immediate Anesthesia Transfer of Care Note  Patient: Joseph Hatfield  Procedure(s) Performed: COLONOSCOPY WITH PROPOFOL (N/A ) ESOPHAGOGASTRODUODENOSCOPY (EGD) WITH PROPOFOL (N/A )  Patient Location: PACU  Anesthesia Type:General  Level of Consciousness: awake and sedated  Airway & Oxygen Therapy: Patient Spontanous Breathing and Patient connected to nasal cannula oxygen  Post-op Assessment: Report given to RN and Post -op Vital signs reviewed and stable  Post vital signs: Reviewed and stable  Last Vitals:  Vitals:   12/01/16 1247  BP: (!) 141/91  Pulse: 65  Resp: 16  Temp: 36.8 C  SpO2: 98%    Last Pain:  Vitals:   12/01/16 1247  TempSrc: Tympanic         Complications: No apparent anesthesia complications

## 2016-12-01 NOTE — Op Note (Signed)
[facility] Gastroenterology Patient Name: Joseph Hatfield Procedure Date: 12/01/2016 1:13 PM MRN: [id number] Account #: [id number] Date of Birth: 04/12/1960 Admit Type: Outpatient Age: [age] [facility] Gender: Male Note Status: Finalized Procedure:            Upper GI endoscopy Indications:          Heartburn Providers:            Scot Junobert T. , MD Referring MD:         Daniel NonesBert Klein, MD (Referring MD) Medicines:            Propofol per Anesthesia Complications:        No immediate complications. Procedure:            Pre-Anesthesia Assessment:                       - After reviewing the risks and benefits, the patient                        was deemed in satisfactory condition to undergo the                        procedure.                       After obtaining informed consent, the endoscope was                        passed under direct vision. Throughout the procedure,                        the patient's blood pressure, pulse, and oxygen                        saturations were monitored continuously. The Endoscope                        was introduced through the mouth, and advanced to the                        second part of duodenum. The upper GI endoscopy was                        accomplished without difficulty. The patient tolerated                        the procedure well. Findings:      The examined esophagus was normal. GEJ was 41cm.      Patchy mild inflammation characterized by erythema and granularity was       found in the gastric antrum. Biopsies were taken with a cold forceps for       histology. Biopsies were taken with a cold forceps for Helicobacter       pylori testing.      The examined duodenum was normal. Impression:           - Normal esophagus.                       - Gastritis. Biopsied.                       -  Normal examined duodenum. Recommendation:       - Await pathology results. Scot Junobert T , MD 12/01/2016  1:24:19 PM This report has been signed electronically. Number of Addenda: 0 Note Initiated On: 12/01/2016 1:13 PM      [facility]

## 2016-12-01 NOTE — Anesthesia Postprocedure Evaluation (Signed)
Anesthesia Post Note  Patient: Rosalva Ferronommy W Mathes  Procedure(s) Performed: COLONOSCOPY WITH PROPOFOL (N/A ) ESOPHAGOGASTRODUODENOSCOPY (EGD) WITH PROPOFOL (N/A )  Patient location during evaluation: Endoscopy Anesthesia Type: General Level of consciousness: awake and alert and oriented Pain management: pain level controlled Vital Signs Assessment: post-procedure vital signs reviewed and stable Respiratory status: spontaneous breathing, nonlabored ventilation and respiratory function stable Cardiovascular status: blood pressure returned to baseline and stable Postop Assessment: no signs of nausea or vomiting Anesthetic complications: no     Last Vitals:  Vitals:   12/01/16 1247 12/01/16 1346  BP: (!) 141/91 99/73  Pulse: 65   Resp: 16   Temp: 36.8 C (!) 35.8 C  SpO2: 98% 92%    Last Pain:  Vitals:   12/01/16 1346  TempSrc: Axillary                 Vickey Ewbank

## 2016-12-01 NOTE — Anesthesia Preprocedure Evaluation (Signed)
Anesthesia Evaluation  Patient identified by MRN, date of birth, ID band Patient awake    Reviewed: Allergy & Precautions, NPO status , Patient's Chart, lab work & pertinent test results  History of Anesthesia Complications Negative for: history of anesthetic complications  Airway Mallampati: III  TM Distance: >3 FB Neck ROM: Full    Dental no notable dental hx.    Pulmonary sleep apnea and Continuous Positive Airway Pressure Ventilation , neg COPD, former smoker,    breath sounds clear to auscultation- rhonchi (-) wheezing      Cardiovascular hypertension, Pt. on medications + Peripheral Vascular Disease (dilated aortic root)  (-) CAD, (-) Past MI and (-) Cardiac Stents  Rhythm:Regular Rate:Normal - Systolic murmurs and - Diastolic murmurs    Neuro/Psych PSYCHIATRIC DISORDERS Anxiety Depression negative neurological ROS     GI/Hepatic Neg liver ROS, GERD  ,  Endo/Other  negative endocrine ROSneg diabetes  Renal/GU negative Renal ROS     Musculoskeletal  (+) Arthritis ,   Abdominal (+) + obese,   Peds  Hematology negative hematology ROS (+)   Anesthesia Other Findings Past Medical History: No date: Allergic rhinitis No date: Anxiety No date: Depression 10/08/2016: Dilated aortic root (HCC)     Comment:  4.5 cm CTA Chest 09/11/2016 No date: GERD (gastroesophageal reflux disease) No date: Hypertension No date: Obesity No date: Osteoarthritis     Comment:  knees No date: Sleep apnea     Comment:  on CPAP   Reproductive/Obstetrics                             Anesthesia Physical Anesthesia Plan  ASA: III  Anesthesia Plan: General   Post-op Pain Management:    Induction: Intravenous  PONV Risk Score and Plan: 2 and Propofol infusion  Airway Management Planned: Natural Airway  Additional Equipment:   Intra-op Plan:   Post-operative Plan:   Informed Consent: I have  reviewed the patients History and Physical, chart, labs and discussed the procedure including the risks, benefits and alternatives for the proposed anesthesia with the patient or authorized representative who has indicated his/her understanding and acceptance.   Dental advisory given  Plan Discussed with: CRNA and Anesthesiologist  Anesthesia Plan Comments:         Anesthesia Quick Evaluation

## 2016-12-01 NOTE — Anesthesia Post-op Follow-up Note (Signed)
Anesthesia QCDR form completed.        

## 2016-12-02 ENCOUNTER — Encounter: Payer: Self-pay | Admitting: Unknown Physician Specialty

## 2016-12-02 LAB — SURGICAL PATHOLOGY

## 2017-03-06 ENCOUNTER — Other Ambulatory Visit: Payer: Self-pay | Admitting: Cardiothoracic Surgery

## 2017-03-06 DIAGNOSIS — I712 Thoracic aortic aneurysm, without rupture, unspecified: Secondary | ICD-10-CM

## 2017-04-16 ENCOUNTER — Ambulatory Visit (INDEPENDENT_AMBULATORY_CARE_PROVIDER_SITE_OTHER): Payer: BLUE CROSS/BLUE SHIELD | Admitting: Cardiothoracic Surgery

## 2017-04-16 ENCOUNTER — Encounter: Payer: Self-pay | Admitting: Cardiothoracic Surgery

## 2017-04-16 ENCOUNTER — Ambulatory Visit
Admission: RE | Admit: 2017-04-16 | Discharge: 2017-04-16 | Disposition: A | Discharge status: Home / Self Care | Payer: BLUE CROSS/BLUE SHIELD | Source: Ambulatory Visit | Attending: Cardiothoracic Surgery | Admitting: Cardiothoracic Surgery

## 2017-04-16 ENCOUNTER — Other Ambulatory Visit: Payer: Self-pay

## 2017-04-16 VITALS — BP 150/81 | HR 62 | Resp 18 | Ht 66.0 in | Wt 246.4 lb

## 2017-04-16 DIAGNOSIS — I712 Thoracic aortic aneurysm, without rupture, unspecified: Secondary | ICD-10-CM

## 2017-04-16 DIAGNOSIS — I77810 Thoracic aortic ectasia: Secondary | ICD-10-CM | POA: Diagnosis not present

## 2017-04-16 MED ORDER — IOPAMIDOL (ISOVUE-370) INJECTION 76%
75.0000 mL | Freq: Once | INTRAVENOUS | Status: AC | PRN
  Administered 2017-04-16: 75 mL via INTRAVENOUS

## 2017-04-16 NOTE — Progress Notes (Signed)
[address]       [place] [address]             [phone]                    Theador Jezewski [facility] Medical Record #[id number] Date of Birth: [birth year]  Referring: Lynnea Ferrier, MD Primary Care: Lynnea Ferrier, MD  Chief Complaint:    Chief Complaint  Patient presents with  . Thoracic Aortic Aneurysm    f/u with chest CT today  . Lung Lesion    seen on last CT    History of Present Illness:    Joseph Hatfield 57 y.o. male is seen in the office today in follow-up of CT scan done 6 months ago for an incidental finding of a dilated ascending aortaPatient has no previous cardiac history. Patient was undergoing.  August 20 18 and was noted a echo stress test several weeks ago  noted to have a 4.5 cm ascending aorta, the report does not define in detail if she he has an aortic bicuspid valve or a trileaflet aortic valve.   The patient does have a history of hypertension, obesity and sleep apnea.   The patient has no family history of aortic aneurysm or aortic dissection, however it should be noted that his father died suddenly of unknown cause at age 43 in 27-Jun-2001, his mother died of COPD in 56 at age of 55 he has a brother and sister with no medical problems 1 uncles had myocardial infarction.   Since last seen the patient denies any specific anginal symptoms.  His wife does note good at times he gets confused at night stumbling, and appears to be "drunk".   Current Activity/ Functional Status:  Patient is independent with mobility/ambulation, transfers, ADL's, IADL's.   Zubrod Score: At the time of surgery this patient's most appropriate activity status/level should be described as: []     0    Normal activity, no symptoms [x]     1    Restricted in physical strenuous activity but ambulatory, able to do out light work []     2    Ambulatory and capable of self care, unable to do work activities, up and about               >50 % of waking hours                               []     3    Only limited self care, in bed greater than 50% of waking hours []     4    Completely disabled, no self care, confined to bed or chair []     5    Moribund   Past Medical History:  Diagnosis Date  . Allergic rhinitis   . Anxiety   . Depression   . Dilated aortic root (HCC) 10/08/2016   4.5 cm CTA Chest 09/11/2016  . GERD (gastroesophageal reflux disease)   . Hypertension   . Obesity   . Osteoarthritis    knees  . Sleep apnea    on CPAP    Past Surgical History:  Procedure Laterality Date  . COLONOSCOPY     adenomatous polyps: [facility] 12/2015  . COLONOSCOPY WITH PROPOFOL N/A 12/01/2016   Procedure: COLONOSCOPY WITH PROPOFOL;  Surgeon: Scot Jun, MD;  Location: [facility] ENDOSCOPY;  Service: Endoscopy;  Laterality: N/A;  . ESOPHAGOGASTRODUODENOSCOPY (EGD) WITH PROPOFOL N/A 12/01/2016   Procedure: ESOPHAGOGASTRODUODENOSCOPY (EGD) WITH PROPOFOL;  Surgeon: Scot Jun, MD;  Location: [facility] ENDOSCOPY;  Service: Endoscopy;  Laterality: N/A;  . HERNIA REPAIR     left inguinal ; Dr Michela Pitcher 04/2003    Family History  Problem Relation Age of Onset  . GER disease Mother   . Colon polyps Mother   . Hypertension Father   . Heart attack Father     Social History   Socioeconomic History  . Marital status: Married    Spouse name: Not on file  . Number of children: Not on file  . Years of education: Not on file  . Highest education level: Not on file  Occupational History  . Not on file  Social Needs  . Financial resource strain: Not on file  . Food insecurity:    Worry: Not on file    Inability: Not on file  . Transportation needs:    Medical: Not on file    Non-medical: Not on file  Tobacco Use  . Smoking status: Former Smoker    Types: Cigarettes  . Smokeless tobacco: Former Neurosurgeon    Types: Chew  Substance and Sexual Activity  . Alcohol use: Yes  . Drug use: No  . Sexual activity: Yes    Partners: Female    Birth control/protection:  None  Lifestyle  . Physical activity:    Days per week: Not on file    Minutes per session: Not on file  . Stress: Not on file  Relationships  . Social connections:    Talks on phone: Not on file    Gets together: Not on file    Attends religious service: Not on file    Active member of club or organization: Not on file    Attends meetings of clubs or organizations: Not on file    Relationship status: Not on file  . Intimate partner violence:    Fear of current or ex partner: Not on file    Emotionally abused: Not on file    Physically abused: Not on file    Forced sexual activity: Not on file  Other Topics Concern  . Not on file  Social History Narrative  . Not on file    Social History   Tobacco Use  Smoking Status Former Smoker  . Types: Cigarettes  Smokeless Tobacco Former Neurosurgeon  . Types: Chew    Social History   Substance and Sexual Activity  Alcohol Use Yes     No Known Allergies  Current Outpatient Medications  Medication Sig Dispense Refill  . busPIRone (BUSPAR) 15 MG tablet Take 1/2-1 tablet by mouth twice a day as directed for anxiety    . citalopram (CELEXA) 40 MG tablet Take 1 tablet by mouth once a day    . metoprolol succinate (TOPROL-XL) 25 MG 24 hr tablet Take by mouth daily.     . pantoprazole (PROTONIX) 40 MG tablet Take by mouth 2 (two) times daily.      No current facility-administered medications for this visit.     Pertinent items are noted in HPI.   Review of Systems:    Review of Systems  Constitutional: Positive for malaise/fatigue and weight loss. Negative for chills, diaphoresis and fever.  HENT: Negative.   Gastrointestinal: Negative for abdominal pain, blood in stool, constipation, diarrhea, heartburn, melena, nausea and vomiting.  Genitourinary: Negative.   Musculoskeletal: Negative.   Skin:  Negative.   Neurological: Positive for dizziness, sensory change and weakness. Negative for tingling, tremors, speech change, focal  weakness, seizures, loss of consciousness and headaches.  Endo/Heme/Allergies: Negative for environmental allergies and polydipsia. Does not bruise/bleed easily.  Psychiatric/Behavioral: Negative.    Immunizations: Flu up to date [y]; Pneumococcal up to date [ y ]; Other:  Physical Exam: BP (!) 150/81 (BP Location: Right Arm, Patient Position: Sitting, Cuff Size: Large)   Pulse 62   Resp 18   Ht 5\' 6"  (1.676 m)   Wt 246 lb 6.4 oz (111.8 kg)   SpO2 97% Comment: RA  BMI 39.77 kg/m   PHYSICAL EXAMINATION: General appearance: alert, cooperative and no distress Head: Normocephalic, without obvious abnormality, atraumatic Neck: no adenopathy, no carotid bruit, no JVD, supple, symmetrical, trachea midline and thyroid not enlarged, symmetric, no tenderness/mass/nodules Lymph nodes: Cervical, supraclavicular, and axillary nodes normal. Resp: clear to auscultation bilaterally Back: symmetric, no curvature. ROM normal. No CVA tenderness. Cardio: regular rate and rhythm, S1, S2 normal, no murmur, click, rub or gallop GI: soft, non-tender; bowel sounds normal; no masses,  no organomegaly Extremities: extremities normal, atraumatic, no cyanosis or edema and Homans sign is negative, no sign of DVT Neurologic: Grossly normal  Diagnostic Studies & Laboratory data:     Recent Radiology Findings:  Ct Angio Chest Aorta W/cm &/or Wo/cm  Result Date: 04/16/2017 CLINICAL DATA:  Evaluate ascending thoracic aortic aneurysm. EXAM: CT ANGIOGRAPHY CHEST WITH CONTRAST TECHNIQUE: Multidetector CT imaging of the chest was performed using the standard protocol during bolus administration of intravenous contrast. Multiplanar CT image reconstructions and MIPs were obtained to evaluate the vascular anatomy. CONTRAST:  75mL ISOVUE-370 IOPAMIDOL (ISOVUE-370) INJECTION 76% COMPARISON:  09/11/2016 FINDINGS: Cardiovascular: The heart is normal in size and stable. No pericardial effusion. Stable fusiform ascending thoracic  aorta aneurysm with maximum measurement of 4.5 cm at the level of the right pulmonary artery. No dissection. The branch vessels are patent. Stable coronary artery calcifications. The pulmonary arteries appear normal. Mediastinum/Nodes: No mediastinal or hilar mass or adenopathy. The esophagus is grossly normal. Lungs/Pleura: No acute pulmonary findings. No worrisome pulmonary lesions. No pulmonary nodules, interstitial lung disease or bronchiectasis. Upper Abdomen: No significant upper abdominal findings. Musculoskeletal: No chest wall mass, supraclavicular or axillary adenopathy. Thyroid gland appears normal. No significant bony findings. Review of the MIP images confirms the above findings. IMPRESSION: 1. Stable fusiform aneurysmal dilatation of the ascending aorta with maximum measurement of 4.5 cm. Ascending thoracic aortic aneurysm. Recommend semi-annual imaging followup by CTA or MRA and referral to cardiothoracic surgery if not already obtained. This recommendation follows 2010 ACCF/AHA/AATS/ACR/ASA/SCA/SCAI/SIR/STS/SVM Guidelines for the Diagnosis and Management of Patients With Thoracic Aortic Disease. Circulation. 2010; 121: Z610-R604 2. No mediastinal or hilar mass or adenopathy. 3. No acute pulmonary findings or worrisome pulmonary lesions. Aortic Atherosclerosis (ICD10-I70.0). Electronically Signed   By: Rudie Meyer M.D.   On: 04/16/2017 10:15  I have independently reviewed the above radiology studies  and reviewed the findings with the patient. Patient does have significant calcification of his coronary arteries noted incidentally on CT   Ct Angio Chest/abd/pel For Dissection W And/or W/wo  Result Date: 09/11/2016 CLINICAL DATA:  Dilated aortic root. EXAM: CT ANGIOGRAPHY CHEST, ABDOMEN AND PELVIS TECHNIQUE: Multidetector CT imaging through the chest, abdomen and pelvis was performed using the standard protocol during bolus administration of intravenous contrast. Multiplanar reconstructed images  and MIPs were obtained and reviewed to evaluate the vascular anatomy. CONTRAST:  100 mL Isovue 370 COMPARISON:  None. FINDINGS: CTA CHEST FINDINGS Cardiovascular: Aortic root at the sinuses of Valsalva measures up to 4.6 cm. The mid ascending thoracic aorta measures 4.5 cm. Proximal aortic arch measures 3.7 cm. Great vessels are patent. Posterior aortic arch measures 3.0 cm. Proximal descending thoracic aorta measures 2.7 cm. The mid descending thoracic aorta measures 2.8 cm. Distal descending thoracic aorta measures 2.5 cm. Negative for an aortic dissection. No significant atherosclerotic disease in the thoracic aorta or great vessels. There are coronary artery calcifications. Heart size is within limits. Main pulmonary arteries are patent. Mediastinum/Nodes: No significant mediastinal or hilar lymphadenopathy. Visualized thyroid tissue is unremarkable. Normal appearance of the esophagus. Lungs/Pleura: Trachea and mainstem bronchi are patent. 2 mm pleural-based nodular density in the left lower lobe on sequence 9 image 42. No significant airspace disease or lung consolidation. No pleural effusions. Musculoskeletal: No acute bone abnormality. Review of the MIP images confirms the above findings. CTA ABDOMEN AND PELVIS FINDINGS VASCULAR Aorta: Normal caliber of the abdominal aorta with mild atherosclerotic disease in the infrarenal abdominal aorta. Negative for an aortic dissection. Celiac: Patent without evidence of aneurysm, dissection, vasculitis or significant stenosis. SMA: Patent without evidence of aneurysm, dissection, vasculitis or significant stenosis. Incidentally, there is a replaced right hepatic artery which is a normal variant. Renals: Both renal arteries are patent without evidence of aneurysm, dissection, vasculitis, fibromuscular dysplasia or significant stenosis. IMA: Patent without evidence of aneurysm, dissection, vasculitis or significant stenosis. Inflow: Patent without evidence of aneurysm,  dissection, vasculitis or significant stenosis. Veins: No obvious venous abnormality within the limitations of this arterial phase study. Review of the MIP images confirms the above findings. NON-VASCULAR Hepatobiliary: Subtle low-density structure along the anterior right hepatic lobe is too small to definitively characterize. Normal appearance of the gallbladder. No biliary dilatation. Pancreas: Normal appearance of the pancreas without inflammation or duct dilatation. Spleen: Normal appearance of spleen without enlargement. Adrenals/Urinary Tract: Normal adrenal glands. Probable 0.6 cm cyst in the posterolateral right kidney. No hydronephrosis. Urinary bladder is unremarkable. Stomach/Bowel: Colonic diverticulosis in the sigmoid colon without acute inflammatory changes. Appendix is normal. No evidence for bowel dilatation or focal bowel wall thickening. Normal appearance stomach and duodenum. Lymphatic: No enlarged abdominal or pelvic lymph nodes. Reproductive: Calcifications in prostate. Other: Changes compatible with left inguinal hernia surgery. No free fluid. No free air. Small umbilical hernia containing fat. Musculoskeletal: Disc space loss at L5-S1 with facet disease at L5-S1. Review of the MIP images confirms the above findings. 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 The ascending thoracic aorta measures up to 4.5 cm. Recommend semi-annual imaging followup by CTA or MRA and referral to cardiothoracic surgery if not already obtained. This recommendation follows 2010 ACCF/AHA/AATS/ACR/ASA/SCA/SCAI/SIR/STS/SVM Guidelines for the Diagnosis and Management of Patients With Thoracic Aortic Disease. Circulation. 2010; 121: Z610-R604 Coronary artery calcifications. Punctate pleural-based nodule in the left lower lobe is nonspecific and probably an incidental finding. This could be related to atelectasis or scarring. No follow-up needed if patient is low-risk (and has no known or  suspected primary neoplasm). Non-contrast chest CT can be considered in 12 months if patient is high-risk. This recommendation follows the consensus statement: Guidelines for Management of Incidental Pulmonary Nodules Detected on CT Images: From the Fleischner Society 2017; Radiology 2017; 284:228-243. No acute abnormality in the chest, abdomen or pelvis. Colonic diverticulosis without acute inflammation. Electronically Signed   By: Richarda Overlie M.D.   On: 09/11/2016 15:49   US Abdomen Limited Ruq  Result Date: 09/16/2016  CLINICAL DATA:  Postprandial right upper quadrant pain EXAM: ULTRASOUND ABDOMEN LIMITED RIGHT UPPER QUADRANT COMPARISON:  09/11/2016 FINDINGS: Gallbladder: No gallstones or wall thickening visualized. No sonographic Murphy sign noted by sonographer. Common bile duct: Diameter: 2.6 mm Liver: Diffuse increased echogenicity compatible with hepatic steatosis. No focal hepatic abnormality or intrahepatic biliary dilatation. Portal vein is patent on color Doppler imaging with normal direction of blood flow towards the liver. IMPRESSION: Negative for gallstones or biliary dilatation. Hepatic steatosis Electronically Signed   By: Judie Petit.  Shick M.D.   On: 09/16/2016 13:39     I have independently reviewed the above radiology studies  and reviewed the findings with the patient.  STRESS ECHO:  INTERNAL MEDICINE DEPARTMENT MEZIAH, BLASINGAME [id number] [facility] Acct #: [id number]  [address] [place] [address] Date: 08/28/2016 03:16 PM  Adult Male Age: 44 yrs  ECHOCARDIOGRAM REPORT Outpatient STUDY:Stress EchoTAPE: KC::KCWI  ECHO:Yes DOPPLER:YesFILE:0000-000-000 MD1:KLEIN, BERT  JACK COLOR:YesCONTRAST:NoMACHINE:PhilipsHeight: 66 in RV BIOPSY:No 3D:No SOUND QLTY:Moderate Weight: 121 lb  MEDIUM:None BSA: 1.6 m2  ___________________________________________________________________________________________  HISTORY:Chest pain REASON:Assess, LV function Indication:Chest pain at rest, unspecified [R07.9 (ICD-10-CM)]  ___________________________________________________________________________________________ STRESS ECHOCARDIOGRAPHY   Protocol:Treadmill (Bruce) Drugs:None Target Heart Rate:141 bpmMaximum Predicted Heart Rate: 166 bpm  +-------------------+-------------------------+-------------------------+------------+  Stage  Duration (mm:ss) Heart Rate (bpm) BP  +-------------------+-------------------------+-------------------------+------------+ RESTING  136  122/78  +-------------------+-------------------------+-------------------------+------------+ EXERCISE  4:39 136  / +-------------------+-------------------------+-------------------------+------------+ RECOVERY  3:4382  181/90  +-------------------+-------------------------+-------------------------+------------+  Stress Duration:4:39 mm:ss Max Stress H.R.:136 bpmTarget Heart Rate Achieved: No Maximum workload of 7.00 METS was achieved during exercise  ___________________________________________________________________________________________  WALL SEGMENT CHANGES  RestStress Anterior Septum Basal:NormalHyperkinetic YNW:[id number]   Apical:NormalHyperkinetic  Anterior Wall Basal:NormalHyperkinetic XBM:[id number]  Apical:NormalHyperkinetic   Lateral Wall Basal:NormalHyperkinetic QIH:[id number]  Apical:NormalHyperkinetic   Posterior Wall Basal:NormalHyperkinetic JJO:[id number]  Inferior Wall Basal:NormalHyperkinetic FTD:[id number]  Apical:NormalHyperkinetic  Inferior Septum Basal:NormalHyperkinetic TDV:[id number]   Resting EF:>55% (Est.) Stress EF: >55% (Est.)  ___________________________________________________________________________________________  ADDITIONAL FINDINGS   ___________________________________________________________________________________________  STRESS ECG RESULTS   ECG Results:Normal  ___________________________________________________________________________________________ ECHOCARDIOGRAPHIC DESCRIPTIONS  LEFT VENTRICLE Size:Normal  Contraction:Normal  LV Masses:No Masses  OEV:OJJK Dias.FxClass:Normal  RIGHT VENTRICLE Size:Normal Free Wall:Normal  Contraction:Normal RV Masses:No mass  PERICARDIUM  Fluid:No effusion   _______________________________________________________________________________________ DOPPLER ECHO and OTHER SPECIAL PROCEDURES  Aortic:MILD ARNo AS 137.3 cm/sec peak vel7.5 mmHg peak grad 5.1 mmHg mean grad 2.3 cm^2 by DOPPLER   Mitral:MILD MRNo MS MV Inflow E Vel=nm*MV Annulus E'Vel=nm* E/E'Ratio=nm*  Tricuspid:MILD TRNo  TS 259.7 cm/sec peak TR vel  Pulmonary:MILD PRNo PS    ___________________________________________________________________________________________ ECHOCARDIOGRAPHIC MEASUREMENTS 2D DIMENSIONS AORTA ValuesNormal RangeMAIN PAValuesNormal Range Annulus:nm* [2.3 - 2.9]PA Main:nm* [1.5 - 2.1] Aorta Sin:nm* [3.1 - 3.7] RIGHT VENTRICLE ST Junction:nm* [2.6 - 3.2]RV Base:nm* [ < 4.2] Asc.Aorta:nm* [2.6 - 3.4] RV Mid:nm* [ < 3.5]  LEFT VENTRICLERV Length:nm* [ < 8.6] LVIDd:5.3 cm[4.2 - 5.9] INFERIOR VENA CAVA LVIDs:2.8 cmMax. IVC:nm* [ <= 2.1]  FS:46.7 %[> 25]Min. IVC:nm* SWT:nm* [0.6 - 1.0] ------------------ PWT:nm* [0.6 - 1.0] nm* - not measured  LEFT ATRIUM LA Diam:nm* [3.0 - 4.0] LA A4C Area:nm* [ < 20] LA Volume:nm* [18 - 58]   ___________________________________________________________________________________________ INTERPRETATION Normal Stress Echocardiogram NORMAL RIGHT VENTRICULAR SYSTOLIC FUNCTION MILD VALVULAR REGURGITATION (See above) NO VALVULAR STENOSIS NOTED Ascending proximal aorta 4.6cm   ___________________________________________________________________________________________ Electronically signed by: Danella Penton, MD on 08/29/2016 01:37 PM Performed By: Rayetta Humphrey, RCS Ordering Physician: Daniel Nones  ___________________________________________________________________________________________  Other Result Information  Interface, Text  Results In - 08/29/2016  1:38 PM EDT  Version 2                 INTERNAL MEDICINE DEPARTMENT               RAIYAN, DALESANDRO                       [facility]                      [id number]                   [facility]                 Acct #: [id number]        [address] [place],  [place] [address]       Date: 08/28/2016 03:16 PM                                                            Adult Male Age: 11 yrs                    ECHOCARDIOGRAM REPORT                   Outpatient           STUDY:Stress Echo        TAPE:                   KC::KCWI            ECHO:Yes   DOPPLER:Yes  FILE:0000-000-000       MD1:  Teena Dunk           COLOR:Yes  CONTRAST:No      MACHINE:Philips      Height: 66 in       RV BIOPSY:No         3D:No   SOUND QLTY:Moderate     Weight: 121 lb          MEDIUM:None                                       BSA: 1.6 m2  ___________________________________________________________________________________________        HISTORY:Chest pain         REASON:Assess, LV function     Indication:Chest pain at rest, unspecified [R07.9 (ICD-10-CM)]  ___________________________________________________________________________________________ STRESS ECHOCARDIOGRAPHY           Protocol:Treadmill (Bruce)             Drugs:None Target Heart Rate:141 bpm        Maximum Predicted Heart Rate: 166 bpm  +-------------------+-------------------------+-------------------------+------------+        Stage            Duration (mm:ss)         Heart Rate (bpm)         BP      +-------------------+-------------------------+-------------------------+------------+       RESTING  136              122/78    +-------------------+-------------------------+-------------------------+------------+       EXERCISE                4:39                     136                /        +-------------------+-------------------------+-------------------------+------------+       RECOVERY                3:43                      82              181/90    +-------------------+-------------------------+-------------------------+------------+    Stress Duration:4:39 mm:ss   Max Stress H.R.:136 bpm        Target Heart Rate Achieved: No Maximum workload of 7.00 METS was achieved during exercise  ___________________________________________________________________________________________  WALL SEGMENT CHANGES                        Rest          Stress Anterior Septum Basal:Normal        Hyperkinetic                   ZOX:WRUEAV        Hyperkinetic                Apical:Normal        Hyperkinetic    Anterior Wall Basal:Normal        Hyperkinetic                   WUJ:WJXBJY        Hyperkinetic                Apical:Normal        Hyperkinetic     Lateral Wall Basal:Normal        Hyperkinetic                   NWG:NFAOZH        Hyperkinetic                Apical:Normal        Hyperkinetic   Posterior Wall Basal:Normal        Hyperkinetic                   YQM:VHQION        Hyperkinetic    Inferior Wall Basal:Normal        Hyperkinetic                   GEX:BMWUXL        Hyperkinetic                Apical:Normal        Hyperkinetic  Inferior Septum Basal:Normal        Hyperkinetic                   KGM:WNUUVO        Hyperkinetic             Resting EF:>55% (Est.)   Stress EF: >55% (Est.)  ___________________________________________________________________________________________  ADDITIONAL FINDINGS   ___________________________________________________________________________________________  STRESS ECG RESULTS  ECG Results:Normal  ___________________________________________________________________________________________ ECHOCARDIOGRAPHIC DESCRIPTIONS  LEFT VENTRICLE         Size:Normal   Contraction:Normal    LV Masses:No Masses          ZOX:WRUELVH:None Dias.FxClass:Normal  RIGHT VENTRICLE         Size:Normal               Free Wall:Normal  Contraction:Normal               RV Masses:No mass  PERICARDIUM        Fluid:No effusion   _______________________________________________________________________________________ DOPPLER ECHO and OTHER SPECIAL PROCEDURES    Aortic:MILD AR                    No AS           137.3 cm/sec peak vel      7.5 mmHg peak grad           5.1 mmHg mean grad         2.3 cm^2 by DOPPLER     Mitral:MILD MR                    No MS           MV Inflow E Vel=nm*        MV Annulus E'Vel=nm*           E/E'Ratio=nm*  Tricuspid:MILD TR                    No TS           259.7 cm/sec peak TR vel  Pulmonary:MILD PR                    No PS    ___________________________________________________________________________________________ ECHOCARDIOGRAPHIC MEASUREMENTS 2D DIMENSIONS AORTA             Values      Normal Range      MAIN PA          Values      Normal Range           Annulus:  nm*       [2.3 - 2.9]                PA Main:  nm*       [1.5 - 2.1]         Aorta Sin:  nm*       [3.1 - 3.7]       RIGHT VENTRICLE       ST Junction:  nm*       [2.6 - 3.2]                RV Base:  nm*       [ < 4.2]         Asc.Aorta:  nm*       [2.6 - 3.4]                 RV Mid:  nm*       [ < 3.5]  LEFT VENTRICLE                                        RV Length:  nm*       [ < 8.6]  LVIDd:  5.3 cm    [4.2 - 5.9]       INFERIOR VENA CAVA             LVIDs:  2.8 cm                              Max. IVC:  nm*       [ <= 2.1]                FS:  46.7 %    [> 25]                    Min. IVC:  nm*               SWT:  nm*       [0.6 - 1.0]                   ------------------               PWT:  nm*       [0.6 - 1.0]                   nm* - not measured  LEFT ATRIUM           LA Diam:  nm*       [3.0 - 4.0]       LA A4C Area:  nm*       [ < 20]         LA  Volume:  nm*       [18 - 58]   ___________________________________________________________________________________________ INTERPRETATION Normal Stress Echocardiogram NORMAL RIGHT VENTRICULAR SYSTOLIC FUNCTION MILD VALVULAR REGURGITATION (See above) NO VALVULAR STENOSIS NOTED Ascending proximal aorta 4.6cm   ___________________________________________________________________________________________ Electronically signed by: Danella Penton, MD on 08/29/2016 01:37 PM             Performed By: Rayetta Humphrey, RCS       Ordering Physician: Daniel Nones     Recent Lab Findings: No results found for: WBC, HGB, HCT, PLT, GLUCOSE, CHOL, TRIG, HDL, LDLDIRECT, LDLCALC, ALT, AST, NA, K, CL, CREATININE, BUN, CO2, TSH, INR, GLUF, HGBA1C  Aortic Size Index=     4.6     /Body surface area is 2.28 meters squared. 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  He will also discuss with his primary care doctor the neurologic symptoms that he is noting at night for further evaluation Follow-up echo cardiogram would be indicated to define if he has a bicuspid or trileaflet aortic valve   Patient was cautioned about, heavy lifting, straining, was cautioned  not to use Cipro. Signs and symptoms of aortic dissection were discussed with him.   Patient was warned about not using Cipro and  similar antibiotics. Recent studies have raised concern that fluoroquinolone antibiotics could be associated with an increased risk of aortic aneurysm Fluoroquinolones have non-antimicrobial properties that might jeopardise the integrity of the extracellular matrix of the vascular wall In a  propensity score matched cohort study in [place], there was a 66% increased rate of aortic aneurysm or dissection associated with oral fluoroquinolone use, compared with amoxicillin use, within a 60 day risk period from start of treatment  I  spent 40 minutes counseling the patient face to face and 50% or more the  time was spent in counseling and coordination of care. The total time spent in the appointment was 60 minutes.  Delight Ovens MD      [address] [place] [place] [address] Office [phone]   Beeper [phone]  04/16/2017 10:59 AM

## 2017-04-16 NOTE — Patient Instructions (Signed)

## 2018-03-17 ENCOUNTER — Other Ambulatory Visit: Payer: Self-pay | Admitting: *Deleted

## 2018-03-17 DIAGNOSIS — I712 Thoracic aortic aneurysm, without rupture, unspecified: Secondary | ICD-10-CM

## 2018-03-28 IMAGING — US US ABDOMEN LIMITED
1 series · 14 of 25 positions shown · non-contrast
Comparison: 09/11/2016

CLINICAL DATA: Postprandial right upper quadrant pain

EXAM:
ULTRASOUND ABDOMEN LIMITED RIGHT UPPER QUADRANT

[Series 1: us abdomen limited · 0.23mm/px · 14 of 37 slices shown]
[im 1/37]
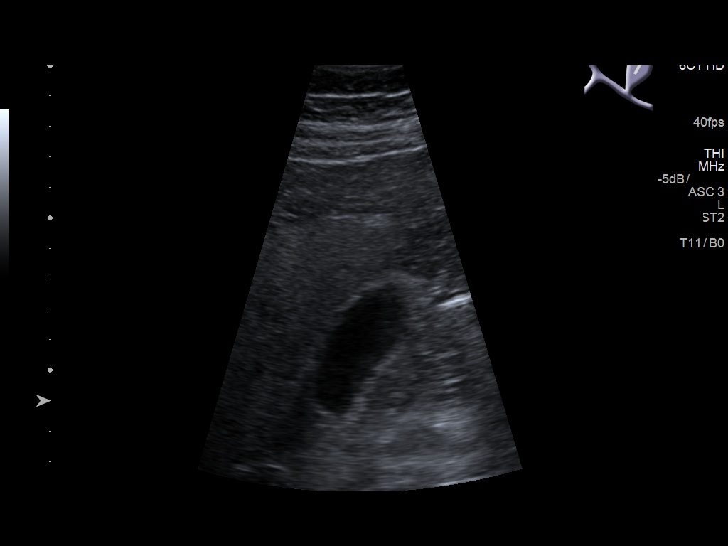
[im 4/37]
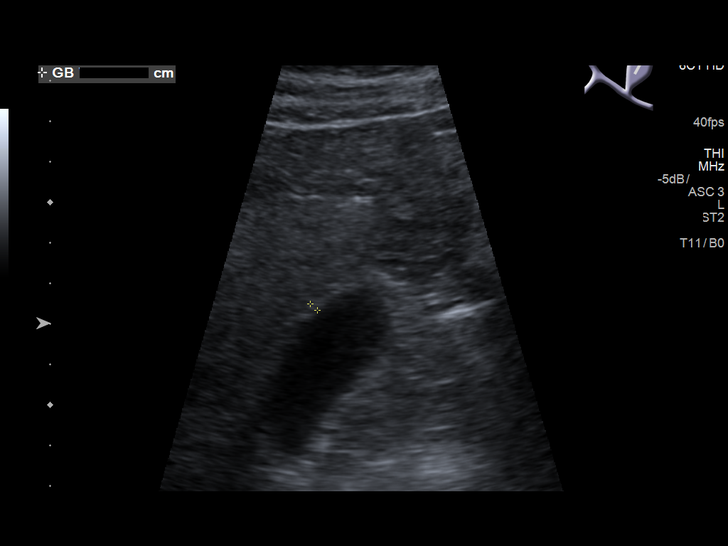
[im 7/37]
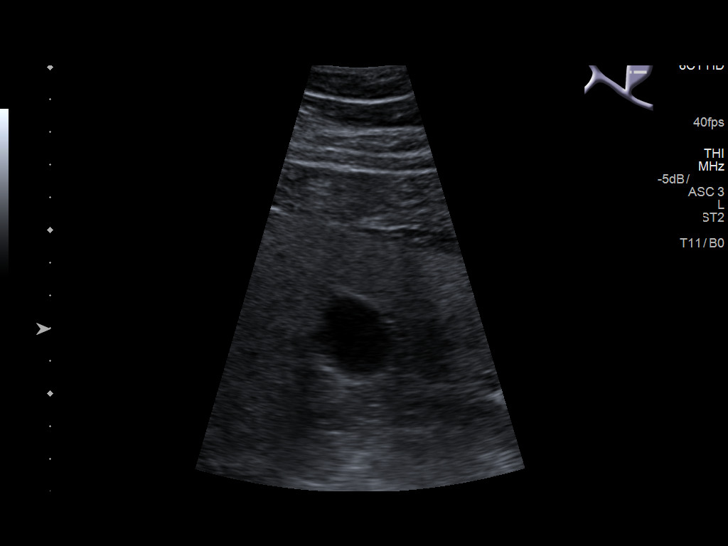
[im 10/37]
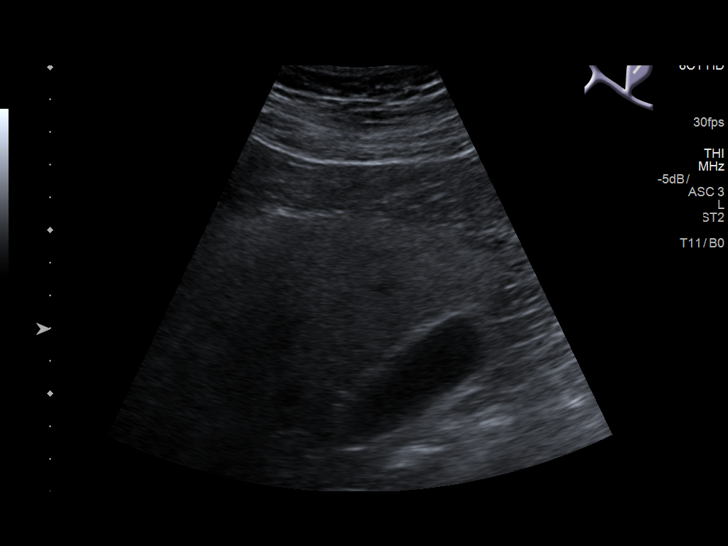
[im 13/37]
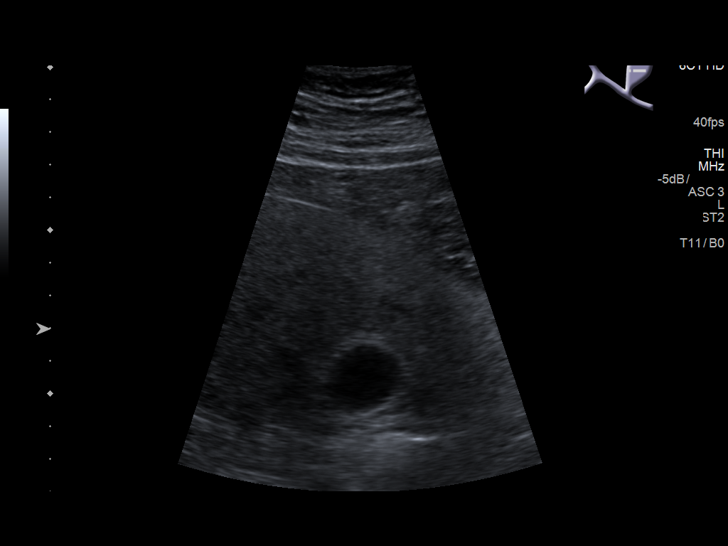
[im 14/37]
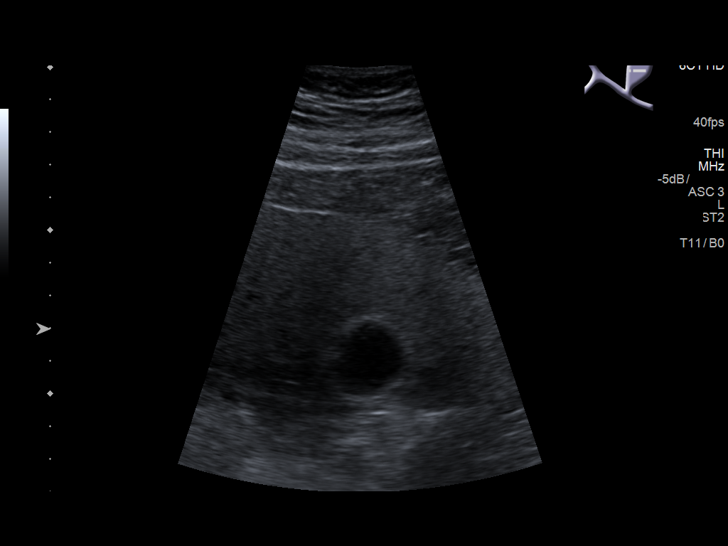
[im 17/37]
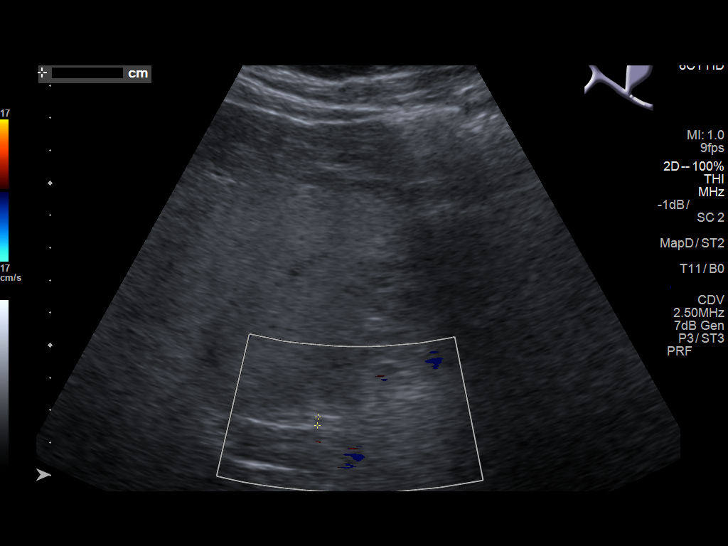
[im 20/37]
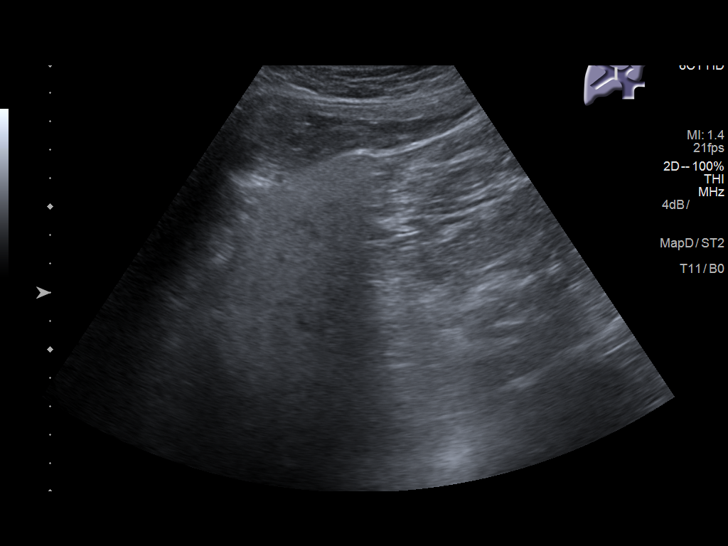
[im 23/37]
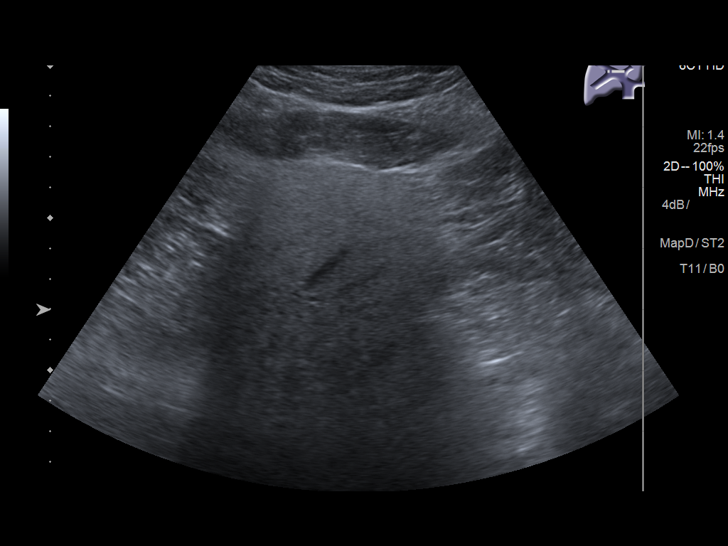
[im 25/37]
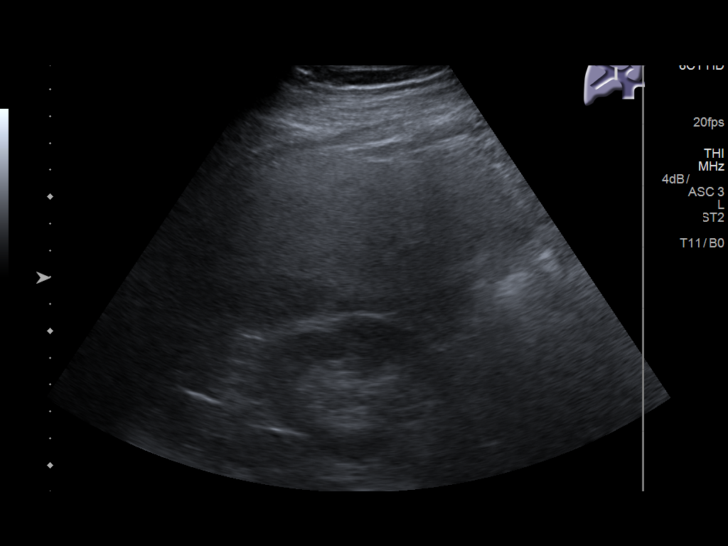
[im 28/37]
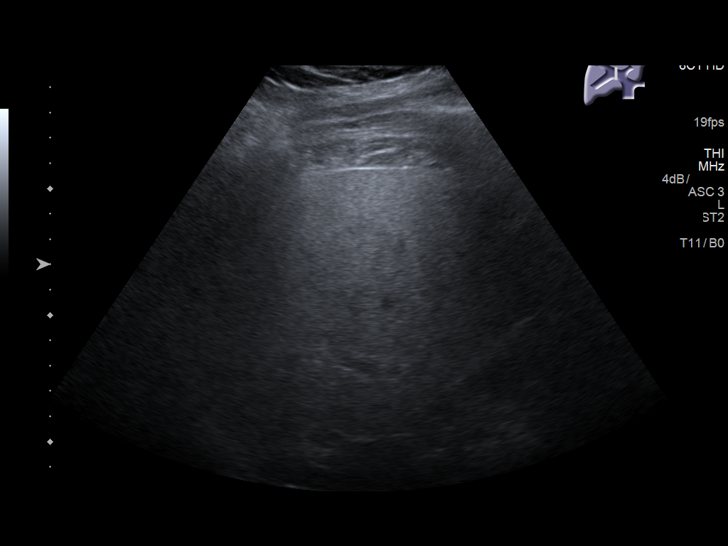
[im 31/37]
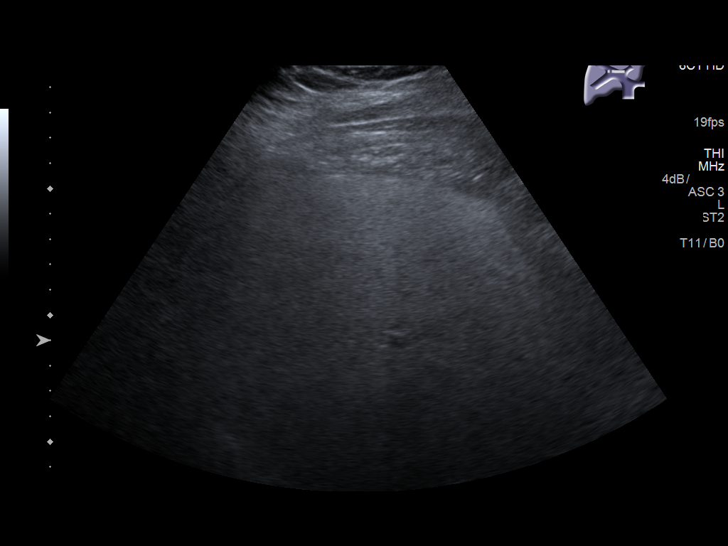
[im 34/37]
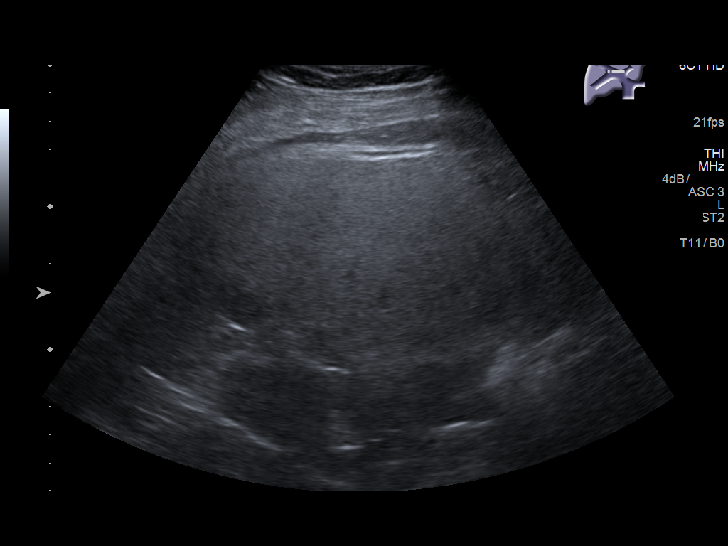
[im 37/37]
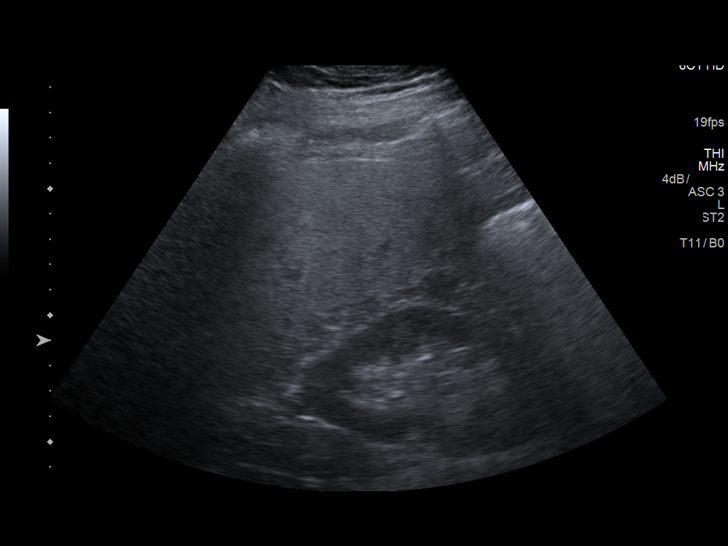

[14 of 25 positions shown; findings below may reference images not displayed]

FINDINGS: Gallbladder:

No gallstones or wall thickening visualized. No sonographic Murphy
sign noted by sonographer.

Common bile duct:

Diameter: 2.6 mm

Liver:

Diffuse increased echogenicity compatible with hepatic steatosis. No
focal hepatic abnormality or intrahepatic biliary dilatation. Portal
vein is patent on color Doppler imaging with normal direction of
blood flow towards the liver.
IMPRESSION: Negative for gallstones or biliary dilatation.

Hepatic steatosis

## 2018-04-22 ENCOUNTER — Other Ambulatory Visit: Payer: BLUE CROSS/BLUE SHIELD

## 2018-04-22 ENCOUNTER — Encounter: Payer: BLUE CROSS/BLUE SHIELD | Admitting: Cardiothoracic Surgery

## 2018-06-07 ENCOUNTER — Other Ambulatory Visit: Payer: Self-pay | Admitting: Cardiothoracic Surgery

## 2018-06-09 ENCOUNTER — Other Ambulatory Visit: Payer: Self-pay

## 2018-06-10 ENCOUNTER — Encounter: Payer: Self-pay | Admitting: Cardiothoracic Surgery

## 2018-06-10 ENCOUNTER — Ambulatory Visit (INDEPENDENT_AMBULATORY_CARE_PROVIDER_SITE_OTHER): Payer: BLUE CROSS/BLUE SHIELD | Admitting: Cardiothoracic Surgery

## 2018-06-10 ENCOUNTER — Ambulatory Visit
Admission: RE | Admit: 2018-06-10 | Discharge: 2018-06-10 | Disposition: A | Discharge status: Home / Self Care | Payer: BLUE CROSS/BLUE SHIELD | Source: Ambulatory Visit | Attending: Cardiothoracic Surgery | Admitting: Cardiothoracic Surgery

## 2018-06-10 VITALS — BP 143/81 | HR 68 | Temp 98.0°F | Resp 16 | Ht 66.0 in | Wt 260.0 lb

## 2018-06-10 DIAGNOSIS — I712 Thoracic aortic aneurysm, without rupture, unspecified: Secondary | ICD-10-CM

## 2018-06-10 MED ORDER — IOPAMIDOL (ISOVUE-370) INJECTION 76%
75.0000 mL | Freq: Once | INTRAVENOUS | Status: AC | PRN
  Administered 2018-06-10: 75 mL via INTRAVENOUS

## 2018-06-10 NOTE — Progress Notes (Signed)
[address]       [place] [id number]             [phone]                    Rosalva Ferronommy W Gesner [place] Medical Record #[id number] Date of Birth: [birth year]  Referring: Lynnea FerrierKlein, Bert J III, MD Primary Care: Lynnea FerrierKlein, Bert J III, MD  Chief Complaint:    Chief Complaint  Patient presents with   TAA    1 yr f/u with CTA CHEST today    History of Present Illness:    Agnes W Wyka 58 y.o. male is seen in the office today in follow-up of CT scan done 12  months ago for an incidental finding of a dilated ascending aorta. Patient has no previous cardiac history.       The patient does have a history of hypertension, obesity and sleep apnea.   The patient has no family history of aortic aneurysm or aortic dissection, however it should be noted that his father died suddenly of unknown cause at age 763 in 2003, his mother died of COPD in [id number] at age of 56 he has a brother and sister with no medical problems 1 uncles had myocardial infarction.   Since last seen the patient denies any specific anginal symptoms.  His wife does note good at times he gets confused at night stumbling, and appears to be "drunk".   Patient notes that 8 months ago he quit completely drinking any alcohol he is a non-smoker, notes that he is now trying to lose weight Current Activity/ Functional Status:  Patient is independent with mobility/ambulation, transfers, ADL's, IADL's.   Zubrod Score: At the time of surgery this patients most appropriate activity status/level should be described as: []     0    Normal activity, no symptoms [x]     1    Restricted in physical strenuous activity but ambulatory, able to do out light work []     2    Ambulatory and capable of self care, unable to do work activities, up and about               >50 % of waking hours                              []     3    Only limited self care, in bed greater than 50% of waking hours []     4    Completely disabled, no self  care, confined to bed or chair []     5    Moribund   Past Medical History:  Diagnosis Date   Allergic rhinitis    Anxiety    Depression    Dilated aortic root (HCC) 10/08/2016   4.5 cm CTA Chest 09/11/2016   GERD (gastroesophageal reflux disease)    Hypertension    Obesity    Osteoarthritis    knees   Sleep apnea    on CPAP    Past Surgical History:  Procedure Laterality Date   COLONOSCOPY     adenomatous polyps: [facility] 12/2015   COLONOSCOPY WITH PROPOFOL N/A 12/01/2016   Procedure: COLONOSCOPY WITH PROPOFOL;  Surgeon: Scot JunElliott, Robert T, MD;  [facility];  Service: Endoscopy;  Laterality: N/A;   ESOPHAGOGASTRODUODENOSCOPY (EGD) WITH PROPOFOL N/A 12/01/2016   Procedure: ESOPHAGOGASTRODUODENOSCOPY (EGD) WITH PROPOFOL;  Surgeon: Scot JunElliott, Robert T,  MD;  Location: [facility] ENDOSCOPY;  Service: Endoscopy;  Laterality: N/A;   HERNIA REPAIR     left inguinal ; Dr Michela Pitcher 04/2003    Family History  Problem Relation Age of Onset   GER disease Mother    Colon polyps Mother    Hypertension Father    Heart attack Father     Social History   Socioeconomic History   Marital status: Married    Spouse name: Not on file   Number of children: Not on file   Years of education: Not on file   Highest education level: Not on file  Occupational History   Not on file  Social Needs   Financial resource strain: Not on file   Food insecurity:    Worry: Not on file    Inability: Not on file   Transportation needs:    Medical: Not on file    Non-medical: Not on file  Tobacco Use   Smoking status: Former Smoker    Types: Cigarettes   Smokeless tobacco: Former Neurosurgeon    Types: Chew  Substance and Sexual Activity   Alcohol use: Yes   Drug use: No   Sexual activity: Yes    Partners: Female    Birth control/protection: None  Lifestyle   Physical activity:    Days per week: Not on file    Minutes per session: Not on file   Stress: Not on file    Relationships   Social connections:    Talks on phone: Not on file    Gets together: Not on file    Attends religious service: Not on file    Active member of club or organization: Not on file    Attends meetings of clubs or organizations: Not on file    Relationship status: Not on file   Intimate partner violence:    Fear of current or ex partner: Not on file    Emotionally abused: Not on file    Physically abused: Not on file    Forced sexual activity: Not on file  Other Topics Concern   Not on file  Social History Narrative   Not on file    Social History   Tobacco Use  Smoking Status Former Smoker   Types: Cigarettes  Smokeless Tobacco Former Neurosurgeon   Types: Sports administrator    Social History   Substance and Sexual Activity  Alcohol Use Yes     Allergies  Allergen Reactions   Quinolones     Current Outpatient Medications  Medication Sig Dispense Refill   atorvastatin (LIPITOR) 10 MG tablet Take 10 mg by mouth daily.     busPIRone (BUSPAR) 15 MG tablet Take 1/2-1 tablet by mouth twice a day as directed for anxiety     citalopram (CELEXA) 40 MG tablet Take 1 tablet by mouth once a day     metoprolol succinate (TOPROL-XL) 25 MG 24 hr tablet Take by mouth daily.      pantoprazole (PROTONIX) 40 MG tablet Take by mouth 2 (two) times daily.      No current facility-administered medications for this visit.     Pertinent items are noted in HPI.   Review of Systems:  Review of Systems  Constitutional: Negative.   HENT: Negative.   Eyes: Negative.   Respiratory: Negative.   Cardiovascular: Negative.   Gastrointestinal: Negative.   Genitourinary: Negative.   Musculoskeletal: Negative.   Skin: Negative.   Neurological: Negative.   Endo/Heme/Allergies: Negative.   Psychiatric/Behavioral: Negative.  Immunizations: Flu up to date [y]; Pneumococcal up to date [ y ]; Other:  Physical Exam: BP (!) 143/81 (BP Location: Right Arm, Patient Position: Sitting,  Cuff Size: Large)    Pulse 68    Temp 98 F (36.7 C) (Oral)    Resp 16    Ht 5\' 6"  (1.676 m)    Wt 260 lb (117.9 kg)    SpO2 97%    BMI 41.97 kg/m   PHYSICAL EXAMINATION: General appearance: alert, cooperative and no distress Head: Normocephalic, without obvious abnormality, atraumatic Neck: no adenopathy, no carotid bruit, no JVD, supple, symmetrical, trachea midline and thyroid not enlarged, symmetric, no tenderness/mass/nodules Lymph nodes: Cervical, supraclavicular, and axillary nodes normal. Resp: clear to auscultation bilaterally Back: symmetric, no curvature. ROM normal. No CVA tenderness. Cardio: regular rate and rhythm, S1, S2 normal, no murmur, click, rub or gallop GI: soft, non-tender; bowel sounds normal; no masses,  no organomegaly Extremities: extremities normal, atraumatic, no cyanosis or edema and Homans sign is negative, no sign of DVT Neurologic: Grossly normal   Diagnostic Studies & Laboratory data:     Recent Radiology Findings:  Ct Angio Chest Aorta W &/or Wo Contrast  Result Date: 06/10/2018 CLINICAL DATA:  Thoracic aortic aneurysm EXAM: CT ANGIOGRAPHY CHEST WITH CONTRAST TECHNIQUE: Multidetector CT imaging of the chest was performed using the standard protocol during bolus administration of intravenous contrast. Multiplanar CT image reconstructions and MIPs were obtained to evaluate the vascular anatomy. CONTRAST:  75mL ISOVUE-370 IOPAMIDOL (ISOVUE-370) INJECTION 76% Creatinine 0.9; GFR 95 COMPARISON:  April 16, 2017 FINDINGS: Cardiovascular: Ascending thoracic aorta measures 4.5 x 4.5 cm in transverse diameter, stable. On the coronal oblique views, the measured diameter at the sinotubular junction measures 4.5 cm. The measured diameter along the anterior aspect of the aortic arch measures 3.2 cm. The mid descending thoracic aorta measures 2.6 x 2.6 cm. There is no thoracic aortic dissection. Visualized great vessels appear unremarkable. No demonstrable pulmonary embolus.  No thoracic aortic aneurysm or dissection. There are foci of coronary artery calcification. Mediastinum/Nodes: Thyroid appears normal. There is no appreciable thoracic adenopathy. No esophageal lesions are evident. Lungs/Pleura: There is slight bibasilar atelectasis. There is no parenchymal lung edema or consolidation. No pleural effusion or pleural thickening. Upper Abdomen: There is hepatic steatosis. Visualized upper abdominal structures otherwise appear unremarkable. Musculoskeletal: There are no blastic or lytic bone lesions. No chest wall lesions are evident. Review of the MIP images confirms the above findings. IMPRESSION: 1. Stable ascending thoracic aortic prominence with a measured transverse diameter of 4.5 x 4.5 cm. No thoracic aortic dissection. Ascending thoracic aortic aneurysm. Recommend semi-annual imaging followup by CTA or MRA and referral to cardiothoracic surgery if not already obtained. This recommendation follows 2010 ACCF/AHA/AATS/ACR/ASA/SCA/SCAI/SIR/STS/SVM Guidelines for the Diagnosis and Management of Patients With Thoracic Aortic Disease. Circulation. 2010; 121: Z610-R604. Aortic aneurysm NOS (ICD10-I71.9). 2.  There are foci of coronary artery calcification. 3.  Areas of slight atelectatic change.  No edema or consolidation. 4.  No evident pulmonary embolus. 5.  No demonstrable thoracic adenopathy. 6.  Hepatic steatosis. Electronically Signed   By: Bretta Bang III M.D.   On: 06/10/2018 10:35   I have independently reviewed the above radiology studies  and reviewed the findings with the patient. Patient does have significant calcification of his coronary arteries noted incidentally on CT   Ct Angio Chest/abd/pel For Dissection W And/or W/wo  Result Date: 09/11/2016 CLINICAL DATA:  Dilated aortic root. EXAM: CT ANGIOGRAPHY CHEST, ABDOMEN AND PELVIS TECHNIQUE:  Multidetector CT imaging through the chest, abdomen and pelvis was performed using the standard protocol during bolus  administration of intravenous contrast. Multiplanar reconstructed images and MIPs were obtained and reviewed to evaluate the vascular anatomy. CONTRAST:  100 mL Isovue 370 COMPARISON:  None. FINDINGS: CTA CHEST FINDINGS Cardiovascular: Aortic root at the sinuses of Valsalva measures up to 4.6 cm. The mid ascending thoracic aorta measures 4.5 cm. Proximal aortic arch measures 3.7 cm. Great vessels are patent. Posterior aortic arch measures 3.0 cm. Proximal descending thoracic aorta measures 2.7 cm. The mid descending thoracic aorta measures 2.8 cm. Distal descending thoracic aorta measures 2.5 cm. Negative for an aortic dissection. No significant atherosclerotic disease in the thoracic aorta or great vessels. There are coronary artery calcifications. Heart size is within limits. Main pulmonary arteries are patent. Mediastinum/Nodes: No significant mediastinal or hilar lymphadenopathy. Visualized thyroid tissue is unremarkable. Normal appearance of the esophagus. Lungs/Pleura: Trachea and mainstem bronchi are patent. 2 mm pleural-based nodular density in the left lower lobe on sequence 9 image 42. No significant airspace disease or lung consolidation. No pleural effusions. Musculoskeletal: No acute bone abnormality. Review of the MIP images confirms the above findings. CTA ABDOMEN AND PELVIS FINDINGS VASCULAR Aorta: Normal caliber of the abdominal aorta with mild atherosclerotic disease in the infrarenal abdominal aorta. Negative for an aortic dissection. Celiac: Patent without evidence of aneurysm, dissection, vasculitis or significant stenosis. SMA: Patent without evidence of aneurysm, dissection, vasculitis or significant stenosis. Incidentally, there is a replaced right hepatic artery which is a normal variant. Renals: Both renal arteries are patent without evidence of aneurysm, dissection, vasculitis, fibromuscular dysplasia or significant stenosis. IMA: Patent without evidence of aneurysm, dissection, vasculitis  or significant stenosis. Inflow: Patent without evidence of aneurysm, dissection, vasculitis or significant stenosis. Veins: No obvious venous abnormality within the limitations of this arterial phase study. Review of the MIP images confirms the above findings. NON-VASCULAR Hepatobiliary: Subtle low-density structure along the anterior right hepatic lobe is too small to definitively characterize. Normal appearance of the gallbladder. No biliary dilatation. Pancreas: Normal appearance of the pancreas without inflammation or duct dilatation. Spleen: Normal appearance of spleen without enlargement. Adrenals/Urinary Tract: Normal adrenal glands. Probable 0.6 cm cyst in the posterolateral right kidney. No hydronephrosis. Urinary bladder is unremarkable. Stomach/Bowel: Colonic diverticulosis in the sigmoid colon without acute inflammatory changes. Appendix is normal. No evidence for bowel dilatation or focal bowel wall thickening. Normal appearance stomach and duodenum. Lymphatic: No enlarged abdominal or pelvic lymph nodes. Reproductive: Calcifications in prostate. Other: Changes compatible with left inguinal hernia surgery. No free fluid. No free air. Small umbilical hernia containing fat. Musculoskeletal: Disc space loss at L5-S1 with facet disease at L5-S1. Review of the MIP images confirms the above findings. 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 The ascending thoracic aorta measures up to 4.5 cm. Recommend semi-annual imaging followup by CTA or MRA and referral to cardiothoracic surgery if not already obtained. This recommendation follows 2010 ACCF/AHA/AATS/ACR/ASA/SCA/SCAI/SIR/STS/SVM Guidelines for the Diagnosis and Management of Patients With Thoracic Aortic Disease. Circulation. 2010; 121: H846-N629 Coronary artery calcifications. Punctate pleural-based nodule in the left lower lobe is nonspecific and probably an incidental finding. This could be related to atelectasis or  scarring. No follow-up needed if patient is low-risk (and has no known or suspected primary neoplasm). Non-contrast chest CT can be considered in 12 months if patient is high-risk. This recommendation follows the consensus statement: Guidelines for Management of Incidental Pulmonary Nodules Detected on CT Images: From the  Fleischner Society 2017; Radiology 2017; [phone]. No acute abnormality in the chest, abdomen or pelvis. Colonic diverticulosis without acute inflammation. Electronically Signed   By: Richarda Overlie M.D.   On: 09/11/2016 15:49   US Abdomen Limited Ruq  Result Date: 09/16/2016 CLINICAL DATA:  Postprandial right upper quadrant pain EXAM: ULTRASOUND ABDOMEN LIMITED RIGHT UPPER QUADRANT COMPARISON:  09/11/2016 FINDINGS: Gallbladder: No gallstones or wall thickening visualized. No sonographic Murphy sign noted by sonographer. Common bile duct: Diameter: 2.6 mm Liver: Diffuse increased echogenicity compatible with hepatic steatosis. No focal hepatic abnormality or intrahepatic biliary dilatation. Portal vein is patent on color Doppler imaging with normal direction of blood flow towards the liver. IMPRESSION: Negative for gallstones or biliary dilatation. Hepatic steatosis Electronically Signed   By: Judie Petit.  Shick M.D.   On: 09/16/2016 13:39     I have independently reviewed the above radiology studies  and reviewed the findings with the patient. ECHO: CARDIOLOGY DEPARTMENT         [facility]                  [id number]      [facility]              Acct #: [id number]      [address] [place], [place] [address]    Date: 02/03/2018 01: 02 PM                                Adult  Male Age: 18 yrs      ECHOCARDIOGRAM REPORT               Outpatient                                [facility]    STUDY:CHEST WALL         TAPE:          MD1: FATH, KENNETH ALAN    ECHO:Yes  DOPPLER:Yes    FILE:          BP: 144/78 mmHg    COLOR:Yes  CONTRAST:No   MACHINE:Philips  RV BIOPSY:No     3D:No SOUND QLTY:Moderate      Height: 67 in   MEDIUM:None                       Weight: 250 lb                                BSA: 2.2 m2 _________________________________________________________________________________________        HISTORY: DOE         REASON: Assess, LV function       INDICATION: Thoracic ascending aortic aneurysm (CMS-HCC) [I71.2 (ICD-10-CM)] _________________________________________________________________________________________ ECHOCARDIOGRAPHIC MEASUREMENTS 2D DIMENSIONS AORTA         Values  Normal Range  MAIN PA     Values  Normal Range        Annulus: 2.2 cm    [2.3-2.9]     PA Main: nm*    [1.5-2.1]       Aorta Sin: 4.3 cm    [3.1-3.7]  RIGHT VENTRICLE      ST Junction: nm*     [2.6-3.2]     RV Base: nm*    [<  4.2]       Asc.Aorta: nm*     [2.6-3.4]     RV Mid: 3.7 cm  [< 3.5] LEFT VENTRICLE                   RV Length: nm*    [<8.6]         LVIDd: 5.4 cm    [4.2-5.9]  INFERIOR VENA CAVA         LVIDs: 3.6 cm            Max. IVC: nm*    [<=2.1]           FS: 32.6 %    [>25]      Min. IVC: nm*          SWT: 1.5 cm    [0.6-1.0]  ------------------          PWT: 1.2 cm    [0.6-1.0]  nm* - not measured LEFT ATRIUM        LA Diam: 4.3 cm    [3.0-4.0]      LA A4C Area: nm*     [<20]       LA Volume: nm*     [18-58] _________________________________________________________________________________________ ECHOCARDIOGRAPHIC DESCRIPTIONS AORTIC ROOT          Size:  MILDLY DILATED       Dissection: INDETERM FOR DISSECTION AORTIC VALVE        Leaflets: Tricuspid          Morphology: Normal        Mobility: Fully mobile LEFT VENTRICLE          Size: Normal            Anterior: Normal      Contraction: Normal             Lateral: Normal       Closest EF: >55% (Estimated)        Septal: Normal       LV Masses: No Masses            Apical: Normal          LVH: MODERATE LVH         Inferior: Normal                           Posterior: Normal      Dias.FxClass: N/A MITRAL VALVE        Leaflets: Normal            Mobility: Fully mobile       Morphology: Normal LEFT ATRIUM          Size: MILDLY ENLARGED       LA Masses: No masses       IA Septum: Normal IAS MAIN PA          Size: Normal PULMONIC VALVE       Morphology: Normal            Mobility: Fully mobile RIGHT VENTRICLE       RV Masses: No Masses             Size: MILDLY ENLARGED       Free Wall: Normal           Contraction: Normal TRICUSPID VALVE        Leaflets: Normal            Mobility: Fully mobile       Morphology: Normal  RIGHT ATRIUM          Size: Normal            RA Other: None        RA Mass: No masses PERICARDIUM         Fluid: No effusion INFERIOR VENACAVA          Size: Not seen Not Seen _________________________________________________________________________________________  DOPPLER ECHO and OTHER SPECIAL PROCEDURES         Aortic: MODERATE AR        No AS             158.7 cm/sec peak vel   10.1 mmHg peak grad         Mitral: MILD MR          No MS             2.4 cm^2 by DOPPLER             MV  Inflow E Vel = 86.8 cm/sec   MV Annulus E'Vel = 5.9 cm/sec             E/E'Ratio = 14.7       Tricuspid: MILD TR          No TS             267.4 cm/sec peak TR vel  33.6 mmHg peak RV pressure       Pulmonary: MILD PR          No PS             120.8 cm/sec peak vel   5.8 mmHg peak grad _________________________________________________________________________________________ INTERPRETATION NORMAL LEFT VENTRICULAR SYSTOLIC FUNCTION  WITH MODERATE LVH NORMAL RIGHT VENTRICULAR SYSTOLIC FUNCTION MODERATE VALVULAR REGURGITATION (See above) NO VALVULAR STENOSIS MODERATE AR MILD MR, TR, PR MILDLY DILATED Ao ROOT EF 55% Closest EF: >55% (Estimated) LVH: MODERATE LVH Aortic: MODERATE AR Mitral: MILD MR Tricuspid: MILD TR Size: MILDLY DILATED _________________________________________________________________________________________ Electronically signed by      MD Mariel Kansky on 02/04/2018 09: 40 AM      Performed By: Merla Riches, RDCS   Ordering Physician: Harold Hedge ________________________________________________________________    STRESS ECHO:  INTERNAL MEDICINE DEPARTMENT [place] [place] [id number] [facility] Acct #: [id number]  [address] [place] [address] Date: 08/28/2016 03:16 PM  Adult Male Age: 27 yrs  ECHOCARDIOGRAM REPORT Outpatient STUDY:Stress EchoTAPE: KC::[id number]  ECHO:Yes DOPPLER:YesFILE:[id number] MD1:KLEIN, BERT JACK COLOR:YesCONTRAST:NoMACHINE:PhilipsHeight: 66 in RV BIOPSY:No 3D:No SOUND QLTY:Moderate Weight: 121 lb   MEDIUM:None BSA: 1.6 m2  ___________________________________________________________________________________________  HISTORY:Chest pain REASON:Assess, LV function Indication:Chest pain at rest, unspecified [R07.9 (ICD-10-CM)]  ___________________________________________________________________________________________ STRESS ECHOCARDIOGRAPHY   Protocol:Treadmill (Bruce) Drugs:None Target Heart Rate:141 bpmMaximum Predicted Heart Rate: 166 bpm  +-------------------+-------------------------+-------------------------+------------+   Stage   Duration (mm:ss)  Heart Rate (bpm)  BP   +-------------------+-------------------------+-------------------------+------------+  RESTING    136   122/78   +-------------------+-------------------------+-------------------------+------------+  EXERCISE   4:39  136   /  +-------------------+-------------------------+-------------------------+------------+  RECOVERY   3:43 82   181/90   +-------------------+-------------------------+-------------------------+------------+  Stress Duration:4:39 mm:ss Max Stress H.R.:136 bpmTarget Heart Rate Achieved: No Maximum workload of 7.00 METS was achieved during exercise  ___________________________________________________________________________________________  WALL SEGMENT CHANGES  RestStress Anterior Septum Basal:NormalHyperkinetic UQJ:[id number]  Apical:NormalHyperkinetic  Anterior Wall Basal:NormalHyperkinetic JGO:[id number]   Apical:NormalHyperkinetic   Lateral Wall Basal:NormalHyperkinetic GTX:[id number]  Apical:NormalHyperkinetic   Posterior Wall Basal:NormalHyperkinetic CWU:[id number]  Inferior Wall Basal:NormalHyperkinetic KJZ:[id number]  Apical:NormalHyperkinetic  Inferior Septum Basal:NormalHyperkinetic ZSM:[id number]   Resting EF:>55% (  Est.) Stress EF: >55% (Est.)  ___________________________________________________________________________________________  ADDITIONAL FINDINGS   ___________________________________________________________________________________________  STRESS ECG RESULTS   ECG Results:Normal  ___________________________________________________________________________________________ ECHOCARDIOGRAPHIC DESCRIPTIONS  LEFT VENTRICLE Size:Normal  Contraction:Normal  LV Masses:No Masses  ZOX:WRUE Dias.FxClass:Normal  RIGHT VENTRICLE Size:Normal Free Wall:Normal  Contraction:Normal RV Masses:No mass  PERICARDIUM  Fluid:No effusion   _______________________________________________________________________________________ DOPPLER ECHO and OTHER SPECIAL PROCEDURES  Aortic:MILD ARNo AS 137.3 cm/sec peak vel7.5 mmHg peak grad 5.1 mmHg mean grad 2.3 cm^2 by DOPPLER   Mitral:MILD MRNo MS MV Inflow E Vel=nm*MV Annulus E'Vel=nm* E/E'Ratio=nm*  Tricuspid:MILD TRNo TS 259.7 cm/sec peak TR vel  Pulmonary:MILD PRNo  PS    ___________________________________________________________________________________________ ECHOCARDIOGRAPHIC MEASUREMENTS 2D DIMENSIONS AORTA ValuesNormal RangeMAIN PAValuesNormal Range Annulus:nm* [2.3 - 2.9]PA Main:nm* [1.5 - 2.1] Aorta Sin:nm* [3.1 - 3.7] RIGHT VENTRICLE ST Junction:nm* [2.6 - 3.2]RV Base:nm* [ < 4.2] Asc.Aorta:nm* [2.6 - 3.4] RV Mid:nm* [ < 3.5]  LEFT VENTRICLERV Length:nm* [ < 8.6] LVIDd:5.3 cm[4.2 - 5.9] INFERIOR VENA CAVA LVIDs:2.8 cmMax. IVC:nm* [ <= 2.1]  FS:46.7 %[> 25]Min. IVC:nm* SWT:nm* [0.6 - 1.0] ------------------ PWT:nm* [0.6 - 1.0] nm* - not measured  LEFT ATRIUM LA Diam:nm* [3.0 - 4.0] LA A4C Area:nm* [ < 20] LA Volume:nm* [18 - 58]   ___________________________________________________________________________________________ INTERPRETATION Normal Stress Echocardiogram NORMAL RIGHT VENTRICULAR SYSTOLIC FUNCTION MILD VALVULAR REGURGITATION (See above) NO VALVULAR STENOSIS NOTED Ascending proximal aorta 4.6cm   ___________________________________________________________________________________________ Electronically signed by: Danella Penton, MD on 08/29/2016 01:37 PM Performed By: Rayetta Humphrey, RCS Ordering Physician: Daniel Nones  ___________________________________________________________________________________________  Other Result Information  Interface, Text Results In - 08/29/2016  1:38 PM EDT       Version 2                 INTERNAL  MEDICINE DEPARTMENT               CALEL, PISARSKI                       [facility]                      [id number]                   [facility]                 Acct #: [id number]        [address] [place],  [place] [address]       Date: 08/28/2016 03:16 PM                                                            Adult Male Age: 40 yrs                    ECHOCARDIOGRAM REPORT                   Outpatient           STUDY:Stress Echo        TAPE:                   KC::KCWI            ECHO:Yes   DOPPLER:Yes  FILE:[id number]  MD1:  Graciela Husbands, BERT JACK           COLOR:Yes  CONTRAST:No      MACHINE:Philips      Height: 66 in       RV BIOPSY:No         3D:No   SOUND QLTY:Moderate     Weight: 121 lb          MEDIUM:None                                       BSA: 1.6 m2  ___________________________________________________________________________________________        HISTORY:Chest pain         REASON:Assess, LV function     Indication:Chest pain at rest, unspecified [R07.9 (ICD-10-CM)]  ___________________________________________________________________________________________ STRESS ECHOCARDIOGRAPHY           Protocol:Treadmill (Bruce)             Drugs:None Target Heart Rate:141 bpm        Maximum Predicted Heart Rate: 166 bpm  +-------------------+-------------------------+-------------------------+------------+         Stage             Duration (mm:ss)          Heart Rate (bpm)          BP       +-------------------+-------------------------+-------------------------+------------+        RESTING                                            136               122/78     +-------------------+-------------------------+-------------------------+------------+        EXERCISE                 4:39                      136                 /        +-------------------+-------------------------+-------------------------+------------+        RECOVERY                 3:43                        82               181/90     +-------------------+-------------------------+-------------------------+------------+    Stress Duration:4:39 mm:ss   Max Stress H.R.:136 bpm        Target Heart Rate Achieved: No Maximum workload of 7.00 METS was achieved during exercise  ___________________________________________________________________________________________  WALL SEGMENT CHANGES                        Rest          Stress Anterior Septum Basal:Normal        Hyperkinetic                   ZOX:WRUEAV        Hyperkinetic                Apical:Normal        Hyperkinetic    Anterior Wall Basal:Normal  Hyperkinetic                   BMW:UXLKGM        Hyperkinetic                Apical:Normal        Hyperkinetic     Lateral Wall Basal:Normal        Hyperkinetic                   WNU:UVOZDG        Hyperkinetic                Apical:Normal        Hyperkinetic   Posterior Wall Basal:Normal        Hyperkinetic                   UYQ:IHKVQQ        Hyperkinetic    Inferior Wall Basal:Normal        Hyperkinetic                   VZD:GLOVFI        Hyperkinetic                Apical:Normal        Hyperkinetic  Inferior Septum Basal:Normal        Hyperkinetic                   EPP:IRJJOA        Hyperkinetic             Resting EF:>55% (Est.)   Stress EF: >55% (Est.)  ___________________________________________________________________________________________  ADDITIONAL FINDINGS   ___________________________________________________________________________________________  STRESS ECG RESULTS                                               ECG Results:Normal  ___________________________________________________________________________________________ ECHOCARDIOGRAPHIC DESCRIPTIONS  LEFT VENTRICLE         Size:Normal  Contraction:Normal    LV Masses:No Masses          CZY:SAYT Dias.FxClass:Normal  RIGHT VENTRICLE         Size:Normal               Free  Wall:Normal  Contraction:Normal               RV Masses:No mass  PERICARDIUM        Fluid:No effusion   _______________________________________________________________________________________ DOPPLER ECHO and OTHER SPECIAL PROCEDURES    Aortic:MILD AR                    No AS           137.3 cm/sec peak vel      7.5 mmHg peak grad           5.1 mmHg mean grad         2.3 cm^2 by DOPPLER     Mitral:MILD MR                    No MS           MV Inflow E Vel=nm*        MV Annulus E'Vel=nm*           E/E'Ratio=nm*  Tricuspid:MILD TR  No TS           259.7 cm/sec peak TR vel  Pulmonary:MILD PR                    No PS    ___________________________________________________________________________________________ ECHOCARDIOGRAPHIC MEASUREMENTS 2D DIMENSIONS AORTA             Values      Normal Range      MAIN PA          Values      Normal Range           Annulus:  nm*       [2.3 - 2.9]                PA Main:  nm*       [1.5 - 2.1]         Aorta Sin:  nm*       [3.1 - 3.7]       RIGHT VENTRICLE       ST Junction:  nm*       [2.6 - 3.2]                RV Base:  nm*       [ < 4.2]         Asc.Aorta:  nm*       [2.6 - 3.4]                 RV Mid:  nm*       [ < 3.5]  LEFT VENTRICLE                                        RV Length:  nm*       [ < 8.6]             LVIDd:  5.3 cm    [4.2 - 5.9]       INFERIOR VENA CAVA             LVIDs:  2.8 cm                              Max. IVC:  nm*       [ <= 2.1]                FS:  46.7 %    [> 25]                    Min. IVC:  nm*               SWT:  nm*       [0.6 - 1.0]                   ------------------               PWT:  nm*       [0.6 - 1.0]                   nm* - not measured  LEFT ATRIUM           LA Diam:  nm*       [3.0 - 4.0]       LA A4C Area:  nm*       [ < 20]  LA Volume:  nm*       [18 -  58]   ___________________________________________________________________________________________ INTERPRETATION Normal Stress Echocardiogram NORMAL RIGHT VENTRICULAR SYSTOLIC FUNCTION MILD VALVULAR REGURGITATION (See above) NO VALVULAR STENOSIS NOTED Ascending proximal aorta 4.6cm   ___________________________________________________________________________________________ Electronically signed by: Danella Penton, MD on 08/29/2016 01:37 PM             Performed By: Rayetta Humphrey, RCS       Ordering Physician: Daniel Nones     Recent Lab Findings: No results found for: WBC, HGB, HCT, PLT, GLUCOSE, CHOL, TRIG, HDL, LDLDIRECT, LDLCALC, ALT, AST, NA, K, CL, CREATININE, BUN, CO2, TSH, INR, GLUF, HGBA1C  Aortic Size Index=     4.6     /Body surface area is 2.34 meters squared. 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 Signs and symptoms of aortic dissection were discussed with him. Plan repeat CTA of the chest in 1 year.   Patient was warned about not using Cipro and similar antibiotics. Recent studies have raised concern that fluoroquinolone antibiotics could be associated with an increased risk of aortic aneurysm Fluoroquinolones have non-antimicrobial properties that might jeopardise the integrity of the extracellular matrix of the vascular wall In a  propensity score matched cohort study in [place], there was a 66% increased rate of aortic aneurysm or dissection associated with oral fluoroquinolone use, compared with  amoxicillin use, within a 60 day risk period from start of treatment    Delight Ovens MD      [address] [place] [address] Office [phone]   Beeper [phone]  06/10/2018 12:39 PM

## 2018-06-10 NOTE — Patient Instructions (Signed)

## 2019-02-27 IMAGING — CT CT ANGIO CHEST
2 of 6 series · 13 of 36 positions shown · IV contrast (iopamidol)
Comparison: 09/11/2016

CLINICAL DATA: Evaluate ascending thoracic aortic aneurysm.

EXAM:
CT ANGIOGRAPHY CHEST WITH CONTRAST
TECHNIQUE: Multidetector CT imaging of the chest was performed using the
standard protocol during bolus administration of intravenous
contrast. Multiplanar CT image reconstructions and MIPs were
obtained to evaluate the vascular anatomy.
CONTRAST:  75mL [id number] IOPAMIDOL ([id number]) INJECTION 76%

[Series 6: cta thorax 2.00 br36 s3 ax axial arterial · axial · arterial · 0.68mm/px · z∈[+1612,+1897]mm · 12 of 170 slices shown]
[im 14/170  lung]
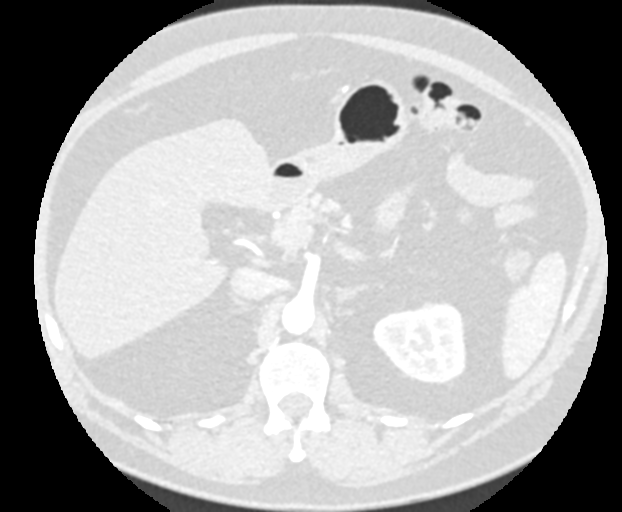
[im 27/170  mediastinal]
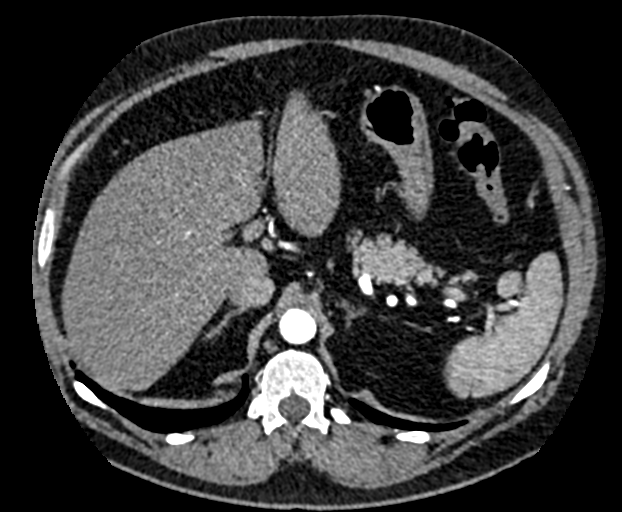
[im 40/170  lung]
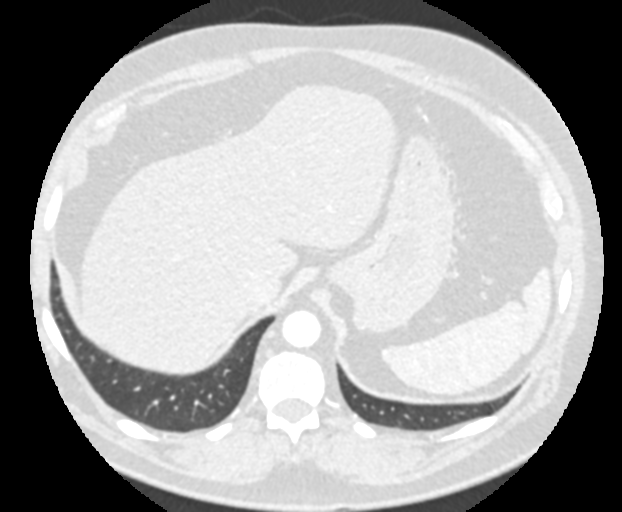
[im 53/170  mediastinal]
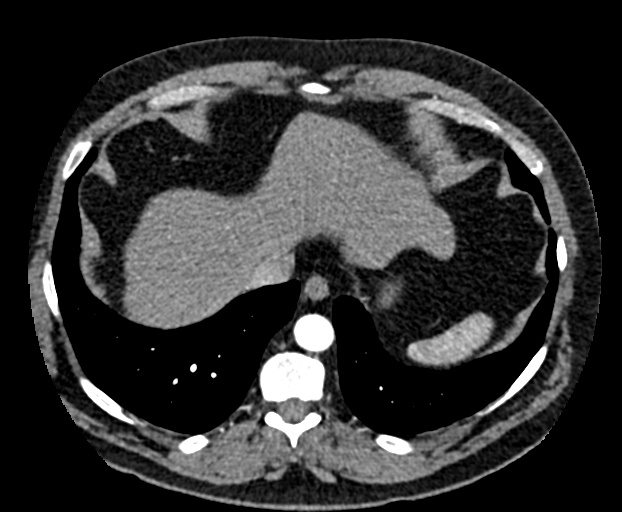
[im 66/170  lung]
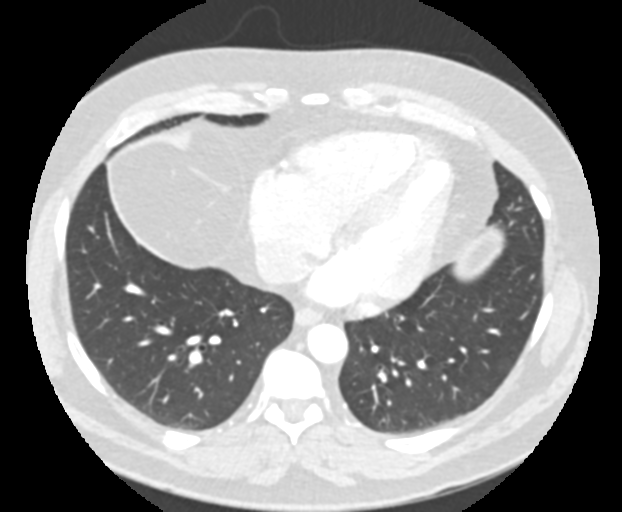
[im 79/170  mediastinal]
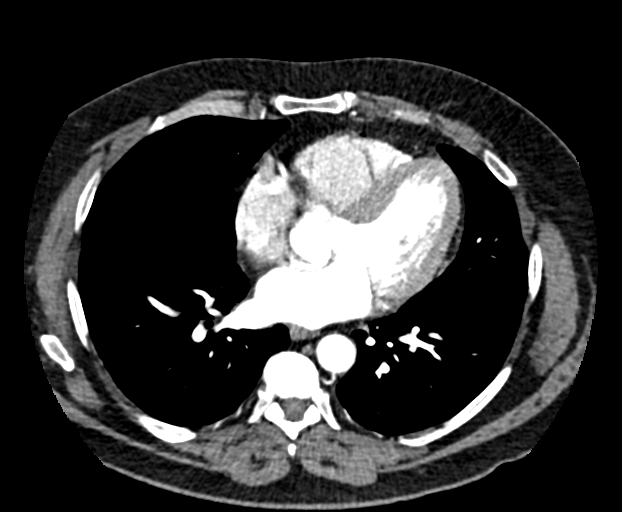
[im 92/170  lung]
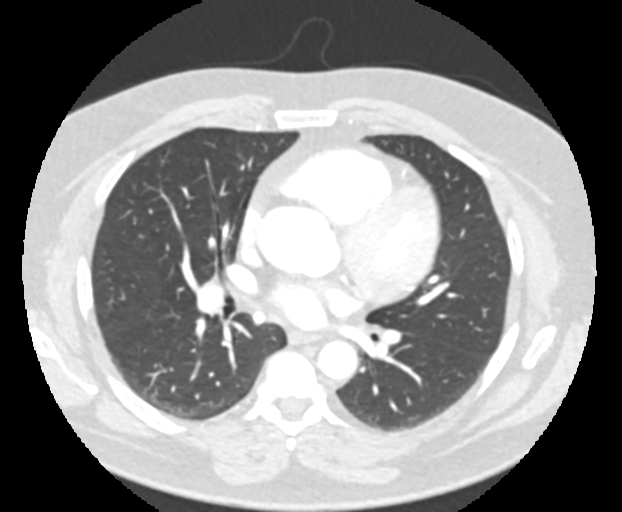
[im 105/170  mediastinal]
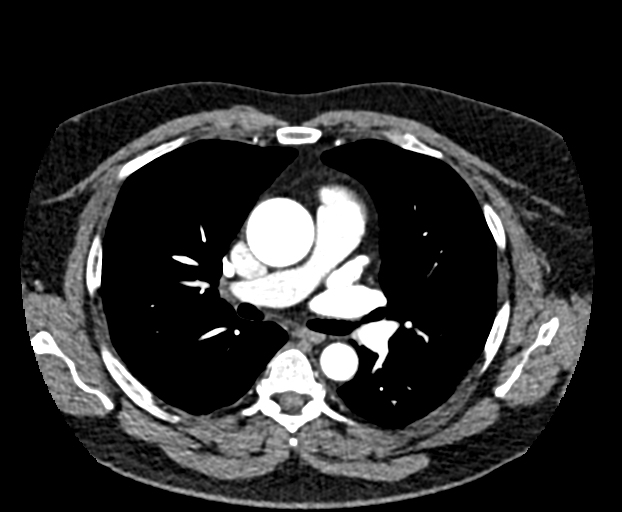
[im 118/170  lung]
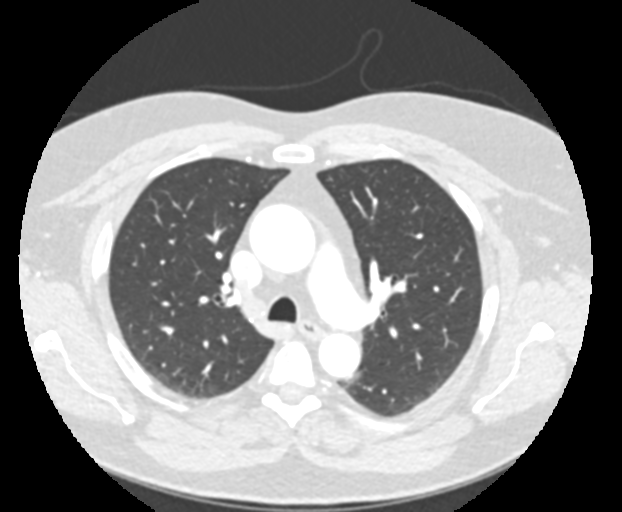
[im 131/170  mediastinal]
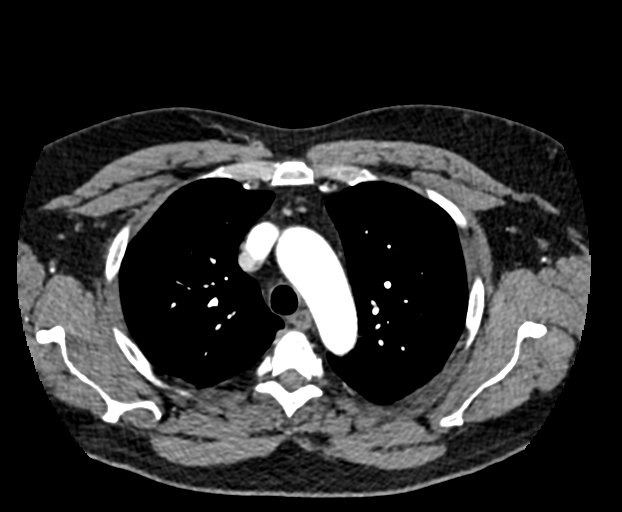
[im 144/170  lung]
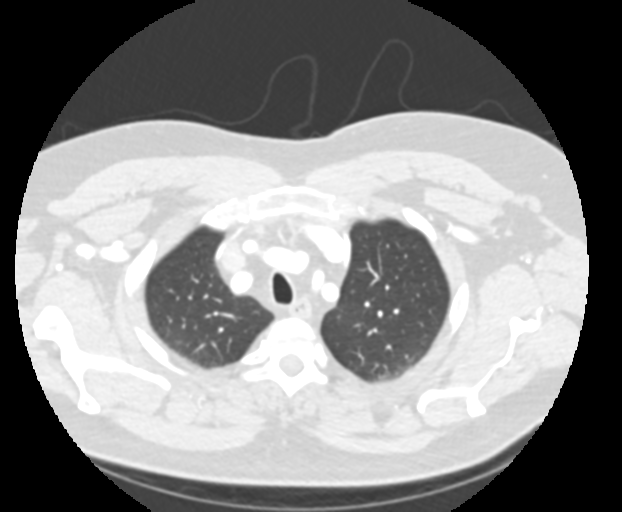
[im 157/170  mediastinal]
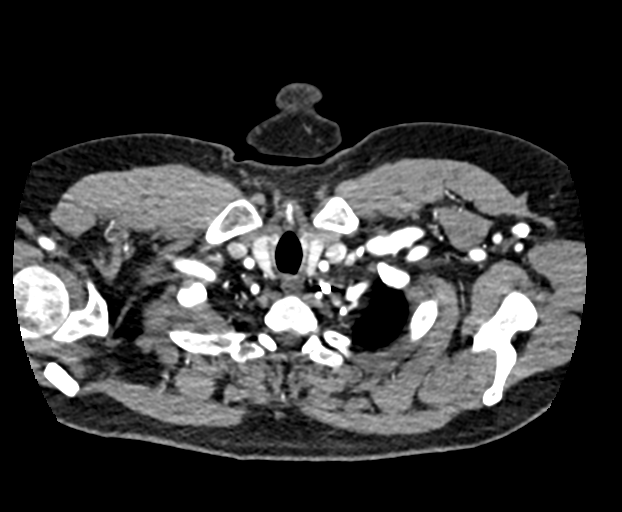

[Series 11: cta thorax 2.00 bv36 s3 cor cor st · coronal · 0.66mm/px · 1 of 173 slices shown]
[im 87/173  mediastinal]
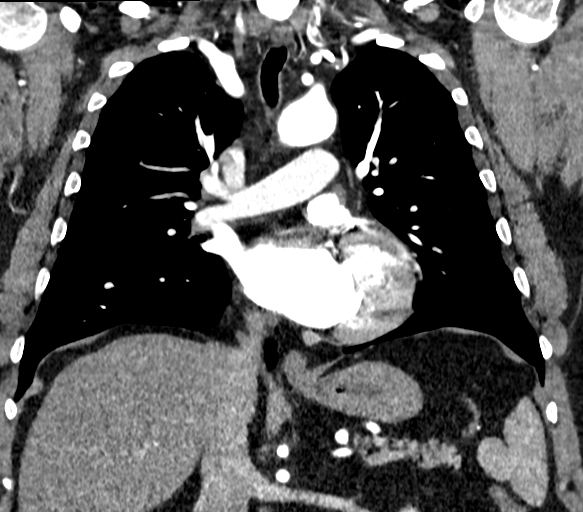

[13 of 36 positions shown; findings below may reference images not displayed]

FINDINGS: Cardiovascular: The heart is normal in size and stable. No
pericardial effusion. Stable fusiform ascending thoracic aorta
aneurysm with maximum measurement of 4.5 cm at the level of the
right pulmonary artery. No dissection. The branch vessels are
patent. Stable coronary artery calcifications. The pulmonary
arteries appear normal.

Mediastinum/Nodes: No mediastinal or hilar mass or adenopathy. The
esophagus is grossly normal.

Lungs/Pleura: No acute pulmonary findings. No worrisome pulmonary
lesions. No pulmonary nodules, interstitial lung disease or
bronchiectasis.

Upper Abdomen: No significant upper abdominal findings.

Musculoskeletal: No chest wall mass, supraclavicular or axillary
adenopathy. Thyroid gland appears normal. No significant bony
findings.

Review of the MIP images confirms the above findings.
IMPRESSION: 1. Stable fusiform aneurysmal dilatation of the ascending aorta with
maximum measurement of 4.5 cm. Ascending thoracic aortic aneurysm.
Recommend semi-annual imaging followup by CTA or MRA and referral to
cardiothoracic surgery if not already obtained. This recommendation
follows 9909 ACCF/AHA/AATS/ACR/ASA/SCA/MOHD NOR/MOEUN/APDI/KAKI Guidelines
for the Diagnosis and Management of Patients With Thoracic Aortic
Disease. Circulation. 9909; 121: [id number]
2. No mediastinal or hilar mass or adenopathy.
3. No acute pulmonary findings or worrisome pulmonary lesions.

Aortic Atherosclerosis ([id number]).

## 2019-04-29 ENCOUNTER — Other Ambulatory Visit: Payer: Self-pay | Admitting: Cardiothoracic Surgery

## 2019-04-29 DIAGNOSIS — I712 Thoracic aortic aneurysm, without rupture, unspecified: Secondary | ICD-10-CM

## 2019-06-15 NOTE — Progress Notes (Signed)
[address]       [place] [address]             [phone]                    Faraz Ponciano Hamil Snyder Medical Record #[id number] Date of Birth: 1960/05/18  Referring: Lynnea Ferrier, MD Primary Care: Lynnea Ferrier, MD  Chief Complaint:    Chief Complaint  Patient presents with  . Thoracic Aortic Aneurysm    1 year f/u with CTA Chest    History of Present Illness:    Joseph Hatfield 59 y.o. male is seen in the office today in follow-up of CT scan for evaluation of dilated ascending aorta    The patient does have a history of hypertension, obesity and sleep apnea.   The patient has no family history of aortic aneurysm or aortic dissection, however it should be noted that his father died suddenly of unknown cause at age 97 in 2001/06/12, his mother died of COPD in 63 at age of 50 he has a brother and sister with no medical problems 1 uncles had myocardial infarction.      Current Activity/ Functional Status:  Patient is independent with mobility/ambulation, transfers, ADL's, IADL's.   Zubrod Score: At the time of surgery this patient's most appropriate activity status/level should be described as: []     0    Normal activity, no symptoms [x]     1    Restricted in physical strenuous activity but ambulatory, able to do out light work []     2    Ambulatory and capable of self care, unable to do work activities, up and about               >50 % of waking hours                              []     3    Only limited self care, in bed greater than 50% of waking hours []     4    Completely disabled, no self care, confined to bed or chair []     5    Moribund   Past Medical History:  Diagnosis Date  . Allergic rhinitis   . Anxiety   . Depression   . Dilated aortic root (HCC) 10/08/2016   4.5 cm CTA Chest 09/11/2016  . GERD (gastroesophageal reflux disease)   . Hypertension   . Obesity   . Osteoarthritis    knees  . Sleep apnea    on CPAP    Past  Surgical History:  Procedure Laterality Date  . COLONOSCOPY     adenomatous polyps: CBF 12/2015  . COLONOSCOPY WITH PROPOFOL N/A 12/01/2016   Procedure: COLONOSCOPY WITH PROPOFOL;  Surgeon: Scot Jun, MD;  Location[facility] ENDOSCOPY;  Service: Endoscopy;  Laterality: N/A;  . ESOPHAGOGASTRODUODENOSCOPY (EGD) WITH PROPOFOL N/A 12/01/2016   Procedure: ESOPHAGOGASTRODUODENOSCOPY (EGD) WITH PROPOFOL;  Surgeon: Scot Jun, MD;  Location: [facility] ENDOSCOPY;  Service: Endoscopy;  Laterality: N/A;  . HERNIA REPAIR     left inguinal ; Dr Michela Pitcher 04/2003    Family History  Problem Relation Age of Onset  . GER disease Mother   . Colon polyps Mother   . Hypertension Father   . Heart attack Father     Social History   Socioeconomic History  .  Marital status: Married    Spouse name: Not on file  . Number of children: Not on file  . Years of education: Not on file  . Highest education level: Not on file  Occupational History  . Not on file  Tobacco Use  . Smoking status: Former Smoker    Types: Cigarettes  . Smokeless tobacco: Former Neurosurgeon    Types: Chew  Substance and Sexual Activity  . Alcohol use: Yes  . Drug use: No  . Sexual activity: Yes    Partners: Female    Birth control/protection: None  Other Topics Concern  . Not on file  Social History Narrative  . Not on file   Social Determinants of Health   Financial Resource Strain:   . Difficulty of Paying Living Expenses:   Food Insecurity:   . Worried About Programme researcher, broadcasting/film/video in the Last Year:   . Barista in the Last Year:   Transportation Needs:   . Freight forwarder (Medical):   Marland Kitchen Lack of Transportation (Non-Medical):   Physical Activity:   . Days of Exercise per Week:   . Minutes of Exercise per Session:   Stress:   . Feeling of Stress :   Social Connections:   . Frequency of Communication with Friends and Family:   . Frequency of Social Gatherings with Friends and Family:   . Attends Religious  Services:   . Active Member of Clubs or Organizations:   . Attends Banker Meetings:   Marland Kitchen Marital Status:   Intimate Partner Violence:   . Fear of Current or Ex-Partner:   . Emotionally Abused:   Marland Kitchen Physically Abused:   . Sexually Abused:     Social History   Tobacco Use  Smoking Status Former Smoker  . Types: Cigarettes  Smokeless Tobacco Former Neurosurgeon  . Types: Chew    Social History   Substance and Sexual Activity  Alcohol Use Yes     Allergies  Allergen Reactions  . Ciprofloxacin Other (See Comments)    Contraindicated with AAA  . Quinolones     Current Outpatient Medications  Medication Sig Dispense Refill  . atorvastatin (LIPITOR) 10 MG tablet Take 10 mg by mouth daily.    . busPIRone (BUSPAR) 15 MG tablet Take 1/2-1 tablet by mouth twice a day as directed for anxiety    . citalopram (CELEXA) 40 MG tablet Take 1 tablet by mouth once a day    . metoprolol succinate (TOPROL-XL) 25 MG 24 hr tablet Take by mouth daily.     . pantoprazole (PROTONIX) 40 MG tablet Take by mouth 2 (two) times daily.      No current facility-administered medications for this visit.    Pertinent items are noted in HPI.   Review of Systems:  Review of Systems  All other systems reviewed and are negative.   Immunizations: Flu up to date [y]; Pneumococcal up to date [ y ]; Other:  Physical Exam: BP 140/74   Pulse (!) 48   Temp 97.9 F (36.6 C) (Skin)   Resp 20   Ht  (1.676 m)   Wt 243 lb (110.2 kg)   SpO2 95% Comment: RA  BMI 39.22 kg/m   PHYSICAL EXAMINATION: General appearance: alert, cooperative and no distress Head: Normocephalic, without obvious abnormality, atraumatic Neck: no adenopathy, no carotid bruit, no JVD, supple, symmetrical, trachea midline and thyroid not enlarged, symmetric, no tenderness/mass/nodules Lymph nodes: Cervical, supraclavicular, and axillary nodes normal.  Resp: clear to auscultation bilaterally Back: symmetric, no curvature.  ROM normal. No CVA tenderness. Cardio: regular rate and rhythm, S1, S2 normal, no murmur, click, rub or gallop GI: soft, non-tender; bowel sounds normal; no masses,  no organomegaly Extremities: extremities normal, atraumatic, no cyanosis or edema and Homans sign is negative, no sign of DVT Neurologic: Grossly normal   Diagnostic Studies & Laboratory data:     Recent Radiology Findings:  CT ANGIO CHEST AORTA W/CM & OR WO/CM  Result Date: 06/16/2019 CLINICAL DATA:  Thoracic aortic prominence EXAM: CT ANGIOGRAPHY CHEST WITH CONTRAST TECHNIQUE: Multidetector CT imaging of the chest was performed using the standard protocol during bolus administration of intravenous contrast. Multiplanar CT image reconstructions and MIPs were obtained to evaluate the vascular anatomy. CONTRAST:  75mL ISOVUE-370 IOPAMIDOL (ISOVUE-370) INJECTION 76% COMPARISON:  Jun 10, 2018. FINDINGS: Cardiovascular: Ascending thoracic aortic diameter measures 4.5 x 4.5 cm in transverse diameter, stable. Transverse diameter at the sinuses of Valsalva measures 4.5 cm. The measured diameter in the anterior aortic arch region measures 3.3 cm, essentially stable allowing for marginal difference in scan plane. The measured diameter of the descending at the main pulmonary outflow tract level measures 2.6 x 2.6 cm, stable. There is no evident thoracic aortic dissection. Visualized great vessels appear unremarkable. No major vessel pulmonary embolus. There are foci of coronary artery calcification. No pericardial effusion or pericardial thickening. Aorta Mediastinum/Nodes: Thyroid appears normal. There is no appreciable thoracic adenopathy. No esophageal lesions are evident. Lungs/Pleura: There is slight atelectatic change in the bases. Lungs elsewhere clear. No edema or airspace opacity. No pleural effusions are evident. Upper Abdomen: There is apparent degree of hepatic steatosis. Visualized upper abdominal structures otherwise appear unremarkable.  Musculoskeletal: No blastic or lytic bone lesions. No evident chest wall lesions. Review of the MIP images confirms the above findings. IMPRESSION: 1. Essentially stable appearance of the thoracic aorta. Ascending thoracic aortic diameter is stable 4.5 x 4.5 cm. No evident dissection. Multiple foci of coronary artery calcification again noted. Ascending thoracic aortic aneurysm. Recommend semi-annual imaging followup by CTA or MRA and referral to cardiothoracic surgery if not already obtained. This recommendation follows 2010 ACCF/AHA/AATS/ACR/ASA/SCA/SCAI/SIR/STS/SVM Guidelines for the Diagnosis and Management of Patients With Thoracic Aortic Disease. Circulation. 2010; 121: A540-J811. Aortic aneurysm NOS (ICD10-I71.9). 2.  No evident pulmonary embolus. 3.  Minimal bibasilar atelectasis.  Lungs otherwise clear. 4.  No evident thoracic adenopathy. 5.  Hepatic steatosis. Electronically Signed   By: Bretta Bang III M.D.   On: 06/16/2019 11:22  I have independently reviewed the above radiology studies  and reviewed the findings with the patient.    Ct Angio Chest Aorta W &/or Wo Contrast  Result Date: 06/10/2018 CLINICAL DATA:  Thoracic aortic aneurysm EXAM: CT ANGIOGRAPHY CHEST WITH CONTRAST TECHNIQUE: Multidetector CT imaging of the chest was performed using the standard protocol during bolus administration of intravenous contrast. Multiplanar CT image reconstructions and MIPs were obtained to evaluate the vascular anatomy. CONTRAST:  75mL ISOVUE-370 IOPAMIDOL (ISOVUE-370) INJECTION 76% Creatinine 0.9; GFR 95 COMPARISON:  April 16, 2017 FINDINGS: Cardiovascular: Ascending thoracic aorta measures 4.5 x 4.5 cm in transverse diameter, stable. On the coronal oblique views, the measured diameter at the sinotubular junction measures 4.5 cm. The measured diameter along the anterior aspect of the aortic arch measures 3.2 cm. The mid descending thoracic aorta measures 2.6 x 2.6 cm. There is no thoracic aortic  dissection. Visualized great vessels appear unremarkable. No demonstrable pulmonary embolus. No thoracic aortic aneurysm or dissection. There  are foci of coronary artery calcification. Mediastinum/Nodes: Thyroid appears normal. There is no appreciable thoracic adenopathy. No esophageal lesions are evident. Lungs/Pleura: There is slight bibasilar atelectasis. There is no parenchymal lung edema or consolidation. No pleural effusion or pleural thickening. Upper Abdomen: There is hepatic steatosis. Visualized upper abdominal structures otherwise appear unremarkable. Musculoskeletal: There are no blastic or lytic bone lesions. No chest wall lesions are evident. Review of the MIP images confirms the above findings. IMPRESSION: 1. Stable ascending thoracic aortic prominence with a measured transverse diameter of 4.5 x 4.5 cm. No thoracic aortic dissection. Ascending thoracic aortic aneurysm. Recommend semi-annual imaging followup by CTA or MRA and referral to cardiothoracic surgery if not already obtained. This recommendation follows 2010 ACCF/AHA/AATS/ACR/ASA/SCA/SCAI/SIR/STS/SVM Guidelines for the Diagnosis and Management of Patients With Thoracic Aortic Disease. Circulation. 2010; 121: Z610-R604: E266-e369. Aortic aneurysm NOS (ICD10-I71.9). 2.  There are foci of coronary artery calcification. 3.  Areas of slight atelectatic change.  No edema or consolidation. 4.  No evident pulmonary embolus. 5.  No demonstrable thoracic adenopathy. 6.  Hepatic steatosis. Electronically Signed   By: Bretta BangWilliam  Woodruff III M.D.   On: 06/10/2018 10:35     Ct Angio Chest/abd/pel For Dissection W And/or W/wo  Result Date: 09/11/2016 CLINICAL DATA:  Dilated aortic root. EXAM: CT ANGIOGRAPHY CHEST, ABDOMEN AND PELVIS TECHNIQUE: Multidetector CT imaging through the chest, abdomen and pelvis was performed using the standard protocol during bolus administration of intravenous contrast. Multiplanar reconstructed images and MIPs were obtained and  reviewed to evaluate the vascular anatomy. CONTRAST:  100 mL Isovue 370 COMPARISON:  None. FINDINGS: CTA CHEST FINDINGS Cardiovascular: Aortic root at the sinuses of Valsalva measures up to 4.6 cm. The mid ascending thoracic aorta measures 4.5 cm. Proximal aortic arch measures 3.7 cm. Great vessels are patent. Posterior aortic arch measures 3.0 cm. Proximal descending thoracic aorta measures 2.7 cm. The mid descending thoracic aorta measures 2.8 cm. Distal descending thoracic aorta measures 2.5 cm. Negative for an aortic dissection. No significant atherosclerotic disease in the thoracic aorta or great vessels. There are coronary artery calcifications. Heart size is within limits. Main pulmonary arteries are patent. Mediastinum/Nodes: No significant mediastinal or hilar lymphadenopathy. Visualized thyroid tissue is unremarkable. Normal appearance of the esophagus. Lungs/Pleura: Trachea and mainstem bronchi are patent. 2 mm pleural-based nodular density in the left lower lobe on sequence 9 image 42. No significant airspace disease or lung consolidation. No pleural effusions. Musculoskeletal: No acute bone abnormality. Review of the MIP images confirms the above findings. CTA ABDOMEN AND PELVIS FINDINGS VASCULAR Aorta: Normal caliber of the abdominal aorta with mild atherosclerotic disease in the infrarenal abdominal aorta. Negative for an aortic dissection. Celiac: Patent without evidence of aneurysm, dissection, vasculitis or significant stenosis. SMA: Patent without evidence of aneurysm, dissection, vasculitis or significant stenosis. Incidentally, there is a replaced right hepatic artery which is a normal variant. Renals: Both renal arteries are patent without evidence of aneurysm, dissection, vasculitis, fibromuscular dysplasia or significant stenosis. IMA: Patent without evidence of aneurysm, dissection, vasculitis or significant stenosis. Inflow: Patent without evidence of aneurysm, dissection, vasculitis or  significant stenosis. Veins: No obvious venous abnormality within the limitations of this arterial phase study. Review of the MIP images confirms the above findings. NON-VASCULAR Hepatobiliary: Subtle low-density structure along the anterior right hepatic lobe is too small to definitively characterize. Normal appearance of the gallbladder. No biliary dilatation. Pancreas: Normal appearance of the pancreas without inflammation or duct dilatation. Spleen: Normal appearance of spleen without enlargement. Adrenals/Urinary Tract: Normal adrenal  glands. Probable 0.6 cm cyst in the posterolateral right kidney. No hydronephrosis. Urinary bladder is unremarkable. Stomach/Bowel: Colonic diverticulosis in the sigmoid colon without acute inflammatory changes. Appendix is normal. No evidence for bowel dilatation or focal bowel wall thickening. Normal appearance stomach and duodenum. Lymphatic: No enlarged abdominal or pelvic lymph nodes. Reproductive: Calcifications in prostate. Other: Changes compatible with left inguinal hernia surgery. No free fluid. No free air. Small umbilical hernia containing fat. Musculoskeletal: Disc space loss at L5-S1 with facet disease at L5-S1. Review of the MIP images confirms the above findings. 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 The ascending thoracic aorta measures up to 4.5 cm. Recommend semi-annual imaging followup by CTA or MRA and referral to cardiothoracic surgery if not already obtained. This recommendation follows 2010 ACCF/AHA/AATS/ACR/ASA/SCA/SCAI/SIR/STS/SVM Guidelines for the Diagnosis and Management of Patients With Thoracic Aortic Disease. Circulation. 2010; 121: P809-X833 Coronary artery calcifications. Punctate pleural-based nodule in the left lower lobe is nonspecific and probably an incidental finding. This could be related to atelectasis or scarring. No follow-up needed if patient is low-risk (and has no known or suspected primary neoplasm).  Non-contrast chest CT can be considered in 12 months if patient is high-risk. This recommendation follows the consensus statement: Guidelines for Management of Incidental Pulmonary Nodules Detected on CT Images: From the Fleischner Society 2017; Radiology 2017; 284:228-243. No acute abnormality in the chest, abdomen or pelvis. Colonic diverticulosis without acute inflammation. Electronically Signed   By: Richarda Overlie M.D.   On: 09/11/2016 15:49   US Abdomen Limited Ruq  Result Date: 09/16/2016 CLINICAL DATA:  Postprandial right upper quadrant pain EXAM: ULTRASOUND ABDOMEN LIMITED RIGHT UPPER QUADRANT COMPARISON:  09/11/2016 FINDINGS: Gallbladder: No gallstones or wall thickening visualized. No sonographic Murphy sign noted by sonographer. Common bile duct: Diameter: 2.6 mm Liver: Diffuse increased echogenicity compatible with hepatic steatosis. No focal hepatic abnormality or intrahepatic biliary dilatation. Portal vein is patent on color Doppler imaging with normal direction of blood flow towards the liver. IMPRESSION: Negative for gallstones or biliary dilatation. Hepatic steatosis Electronically Signed   By: Judie Petit.  Shick M.D.   On: 09/16/2016 13:39     I have independently reviewed the above radiology studies  and reviewed the findings with the patient. ECHO: CARDIOLOGY DEPARTMENT         GOVERNOR, MATOS      [facility]                  [id number]      [facility]              Acct #: [id number]      [address] [place], [place] [address]    Date: 02/03/2018 01: 02 PM                                Adult  Male Age: 63 yrs      ECHOCARDIOGRAM REPORT               Outpatient                                [facility]    STUDY:CHEST WALL        TAPE:          MD1: Denton Ar    ECHO:Yes  DOPPLER:Yes    FILE:  BP: 144/78 mmHg    COLOR:Yes  CONTRAST:No   MACHINE:Philips  RV BIOPSY:No     3D:No SOUND QLTY:Moderate      Height: 67 in   MEDIUM:None                       Weight: 250 lb                                BSA: 2.2 m2 _________________________________________________________________________________________        HISTORY: DOE         REASON: Assess, LV function       INDICATION: Thoracic ascending aortic aneurysm (CMS-HCC) [I71.2 (ICD-10-CM)] _________________________________________________________________________________________ ECHOCARDIOGRAPHIC MEASUREMENTS 2D DIMENSIONS AORTA         Values  Normal Range  MAIN PA     Values  Normal Range        Annulus: 2.2 cm    [2.3-2.9]     PA Main: nm*    [1.5-2.1]       Aorta Sin: 4.3 cm    [3.1-3.7]  RIGHT VENTRICLE      ST Junction: nm*     [2.6-3.2]     RV Base: nm*    [<4.2]       Asc.Aorta: nm*     [2.6-3.4]     RV Mid: 3.7 cm  [< 3.5] LEFT VENTRICLE                   RV Length: nm*    [<8.6]         LVIDd: 5.4 cm    [4.2-5.9]  INFERIOR VENA CAVA         LVIDs: 3.6 cm            Max. IVC: nm*    [<=2.1]           FS: 32.6 %    [>25]      Min. IVC: nm*          SWT: 1.5 cm    [0.6-1.0]  ------------------          PWT: 1.2 cm    [0.6-1.0]  nm* - not measured LEFT ATRIUM        LA Diam: 4.3 cm    [3.0-4.0]      LA A4C Area: nm*     [<20]       LA Volume: nm*     [18-58] _________________________________________________________________________________________ ECHOCARDIOGRAPHIC DESCRIPTIONS AORTIC ROOT          Size: MILDLY DILATED       Dissection: INDETERM FOR DISSECTION AORTIC VALVE        Leaflets:  Tricuspid          Morphology: Normal        Mobility: Fully mobile LEFT VENTRICLE          Size: Normal            Anterior: Normal      Contraction: Normal             Lateral: Normal       Closest EF: >55% (Estimated)        Septal: Normal       LV Masses: No Masses            Apical: Normal          LVH: MODERATE LVH  Inferior: Normal                           Posterior: Normal      Dias.FxClass: N/A MITRAL VALVE        Leaflets: Normal            Mobility: Fully mobile       Morphology: Normal LEFT ATRIUM          Size: MILDLY ENLARGED       LA Masses: No masses       IA Septum: Normal IAS MAIN PA          Size: Normal PULMONIC VALVE       Morphology: Normal            Mobility: Fully mobile RIGHT VENTRICLE       RV Masses: No Masses             Size: MILDLY ENLARGED       Free Wall: Normal           Contraction: Normal TRICUSPID VALVE        Leaflets: Normal            Mobility: Fully mobile       Morphology: Normal RIGHT ATRIUM          Size: Normal            RA Other: None        RA Mass: No masses PERICARDIUM         Fluid: No effusion INFERIOR VENACAVA          Size: Not seen Not Seen _________________________________________________________________________________________  DOPPLER ECHO and OTHER SPECIAL PROCEDURES         Aortic: MODERATE AR        No AS             158.7 cm/sec peak vel   10.1 mmHg peak grad         Mitral: MILD MR          No MS             2.4 cm^2 by DOPPLER             MV Inflow E Vel = 86.8 cm/sec   MV Annulus E'Vel = 5.9 cm/sec             E/E'Ratio = 14.7        Tricuspid: MILD TR          No TS             267.4 cm/sec peak TR vel  33.6 mmHg peak RV pressure       Pulmonary: MILD PR          No PS             120.8 cm/sec peak vel   5.8 mmHg peak grad _________________________________________________________________________________________ INTERPRETATION NORMAL LEFT VENTRICULAR SYSTOLIC FUNCTION  WITH MODERATE LVH NORMAL RIGHT VENTRICULAR SYSTOLIC FUNCTION MODERATE VALVULAR REGURGITATION (See above) NO VALVULAR STENOSIS MODERATE AR MILD MR, TR, PR MILDLY DILATED Ao ROOT EF 55% Closest EF: >55% (Estimated) LVH: MODERATE LVH Aortic: MODERATE AR Mitral: MILD MR Tricuspid: MILD TR Size: MILDLY DILATED _________________________________________________________________________________________ Electronically signed by      MD Mariel Kansky on 02/04/2018 09: 40 AM      Performed By: Merla Riches, RDCS   Ordering Physician: Harold Hedge ________________________________________________________________    STRESS ECHO:  INTERNAL MEDICINE DEPARTMENT ANDRICK, RUST [id number] [facility] Acct #: [id number]  [place], [place] [address] Date: 08/28/2016 03:16 PM  Adult Male Age: 83 yrs  ECHOCARDIOGRAM REPORT Outpatient STUDY:Stress EchoTAPE: [id number]::[id number]  ECHO:Yes DOPPLER:YesFILE:[id number] MD1:KLEIN, BERT JACK COLOR:YesCONTRAST:NoMACHINE:PhilipsHeight: 85 in RV BIOPSY:No 3D:No SOUND QLTY:Moderate Weight: 121 lb  MEDIUM:None BSA: 1.6  m2  ___________________________________________________________________________________________  HISTORY:Chest pain REASON:Assess, LV function Indication:Chest pain at rest, unspecified [R07.9 (ICD-10-CM)]  ___________________________________________________________________________________________ STRESS ECHOCARDIOGRAPHY   Protocol:Treadmill (Bruce) Drugs:None Target Heart Rate:141 bpmMaximum Predicted Heart Rate: 166 bpm  +-------------------+-------------------------+-------------------------+------------+  Stage  Duration (mm:ss) Heart Rate (bpm) BP  +-------------------+-------------------------+-------------------------+------------+ RESTING  136  122/78  +-------------------+-------------------------+-------------------------+------------+ EXERCISE  4:39 136  / +-------------------+-------------------------+-------------------------+------------+ RECOVERY  3:4382  181/90  +-------------------+-------------------------+-------------------------+------------+  Stress Duration:4:39 mm:ss Max Stress H.R.:136 bpmTarget Heart Rate Achieved: No Maximum workload of 7.00 METS was achieved during exercise  ___________________________________________________________________________________________  WALL SEGMENT CHANGES  RestStress Anterior Septum Basal:NormalHyperkinetic YNW:[id number]  Apical:NormalHyperkinetic  Anterior Wall Basal:NormalHyperkinetic XBM:[id number]  Apical:NormalHyperkinetic   Lateral Wall  Basal:NormalHyperkinetic QIH:[id number]  Apical:NormalHyperkinetic   Posterior Wall Basal:NormalHyperkinetic JJO:[id number]  Inferior Wall Basal:NormalHyperkinetic FTD:[id number]  Apical:NormalHyperkinetic  Inferior Septum Basal:NormalHyperkinetic TDV:[id number]   Resting EF:>55% (Est.) Stress EF: >55% (Est.)  ___________________________________________________________________________________________  ADDITIONAL FINDINGS   ___________________________________________________________________________________________  STRESS ECG RESULTS   ECG Results:Normal  ___________________________________________________________________________________________ ECHOCARDIOGRAPHIC DESCRIPTIONS  LEFT VENTRICLE Size:Normal  Contraction:Normal  LV Masses:No Masses  OEV:OJJK Dias.FxClass:Normal  RIGHT VENTRICLE Size:Normal Free Wall:Normal  Contraction:Normal RV Masses:No mass  PERICARDIUM  Fluid:No effusion   _______________________________________________________________________________________ DOPPLER ECHO and OTHER SPECIAL PROCEDURES  Aortic:MILD ARNo AS 137.3 cm/sec peak vel7.5 mmHg peak grad 5.1 mmHg mean grad 2.3 cm^2 by DOPPLER   Mitral:MILD MRNo MS MV Inflow E Vel=nm*MV Annulus E'Vel=nm* E/E'Ratio=nm*  Tricuspid:MILD TRNo TS 259.7 cm/sec peak TR vel  Pulmonary:MILD PRNo PS    ___________________________________________________________________________________________ ECHOCARDIOGRAPHIC  MEASUREMENTS 2D DIMENSIONS AORTA ValuesNormal RangeMAIN PAValuesNormal Range Annulus:nm* [2.3 - 2.9]PA Main:nm* [1.5 - 2.1] Aorta Sin:nm* [3.1 - 3.7] RIGHT VENTRICLE ST Junction:nm* [2.6 - 3.2]RV Base:nm* [ < 4.2] Asc.Aorta:nm* [2.6 - 3.4] RV Mid:nm* [ < 3.5]  LEFT VENTRICLERV Length:nm* [ < 8.6] LVIDd:5.3 cm[4.2 - 5.9] INFERIOR VENA CAVA LVIDs:2.8 cmMax. IVC:nm* [ <= 2.1]  FS:46.7 %[> 25]Min. IVC:nm* SWT:nm* [0.6 - 1.0] ------------------ PWT:nm* [0.6 - 1.0] nm* - not measured  LEFT ATRIUM LA Diam:nm* [3.0 - 4.0] LA A4C Area:nm* [ < 20] LA Volume:nm* [18 - 58]   ___________________________________________________________________________________________ INTERPRETATION Normal Stress Echocardiogram NORMAL RIGHT VENTRICULAR SYSTOLIC FUNCTION MILD VALVULAR REGURGITATION (See above) NO VALVULAR STENOSIS NOTED Ascending proximal aorta 4.6cm   ___________________________________________________________________________________________ Electronically signed by: Rusty Aus, MD on 08/29/2016 01:37 PM Performed By: Scherrie November, RCS Ordering Physician: [facility]  ___________________________________________________________________________________________  Other Result Information  Interface, Text Results In - 08/29/2016  1:38 PM EDT       Version 2                 INTERNAL MEDICINE DEPARTMENT               [place] [place]  Acct #: [id number]        [address] [place],  [place] [address]       Date: 08/28/2016 03:16 PM                                                            Adult Male Age: 78 yrs                    ECHOCARDIOGRAM REPORT                   Outpatient           STUDY:Stress Echo        TAPE:                   KC::[id number]            ECHO:Yes   DOPPLER:Yes  FILE:[id number]       MD1:  Graciela Husbands, BERT JACK           COLOR:Yes  CONTRAST:No      MACHINE:Philips      Height: 66 in       RV BIOPSY:No         3D:No   SOUND QLTY:Moderate     Weight: 121 lb          MEDIUM:None                                       BSA: 1.6 m2  ___________________________________________________________________________________________        HISTORY:Chest pain         REASON:Assess, LV function     Indication:Chest pain at rest, unspecified [R07.9 (ICD-10-CM)]  ___________________________________________________________________________________________ STRESS ECHOCARDIOGRAPHY           Protocol:Treadmill (Bruce)             Drugs:None Target Heart Rate:141 bpm        Maximum Predicted Heart Rate: 166 bpm  +-------------------+-------------------------+-------------------------+------------+        Stage            Duration (mm:ss)         Heart Rate (bpm)         BP      +-------------------+-------------------------+-------------------------+------------+       RESTING                                          136              122/78    +-------------------+-------------------------+-------------------------+------------+       EXERCISE                4:39                     136                /       +-------------------+-------------------------+-------------------------+------------+       RECOVERY                3:43  82              181/90     +-------------------+-------------------------+-------------------------+------------+    Stress Duration:4:39 mm:ss   Max Stress H.R.:136 bpm        Target Heart Rate Achieved: No Maximum workload of 7.00 METS was achieved during exercise  ___________________________________________________________________________________________  WALL SEGMENT CHANGES                        Rest          Stress Anterior Septum Basal:Normal        Hyperkinetic                   HCW:CBJSEG        Hyperkinetic                Apical:Normal        Hyperkinetic    Anterior Wall Basal:Normal        Hyperkinetic                   BTD:VVOHYW        Hyperkinetic                Apical:Normal        Hyperkinetic     Lateral Wall Basal:Normal        Hyperkinetic                   VPX:TGGYIR        Hyperkinetic                Apical:Normal        Hyperkinetic   Posterior Wall Basal:Normal        Hyperkinetic                   SWN:IOEVOJ        Hyperkinetic    Inferior Wall Basal:Normal        Hyperkinetic                   JKK:XFGHWE        Hyperkinetic                Apical:Normal        Hyperkinetic  Inferior Septum Basal:Normal        Hyperkinetic                   XHB:ZJIRCV        Hyperkinetic             Resting EF:>55% (Est.)   Stress EF: >55% (Est.)  ___________________________________________________________________________________________  ADDITIONAL FINDINGS   ___________________________________________________________________________________________  STRESS ECG RESULTS                                               ECG Results:Normal  ___________________________________________________________________________________________ ECHOCARDIOGRAPHIC DESCRIPTIONS  LEFT VENTRICLE         Size:Normal  Contraction:Normal    LV Masses:No Masses          ELF:YBOF Dias.FxClass:Normal  RIGHT VENTRICLE         Size:Normal               Free Wall:Normal  Contraction:Normal               RV  Masses:No mass  PERICARDIUM  Fluid:No effusion   _______________________________________________________________________________________ DOPPLER ECHO and OTHER SPECIAL PROCEDURES    Aortic:MILD AR                    No AS           137.3 cm/sec peak vel      7.5 mmHg peak grad           5.1 mmHg mean grad         2.3 cm^2 by DOPPLER     Mitral:MILD MR                    No MS           MV Inflow E Vel=nm*        MV Annulus E'Vel=nm*           E/E'Ratio=nm*  Tricuspid:MILD TR                    No TS           259.7 cm/sec peak TR vel  Pulmonary:MILD PR                    No PS    ___________________________________________________________________________________________ ECHOCARDIOGRAPHIC MEASUREMENTS 2D DIMENSIONS AORTA             Values      Normal Range      MAIN PA          Values      Normal Range           Annulus:  nm*       [2.3 - 2.9]                PA Main:  nm*       [1.5 - 2.1]         Aorta Sin:  nm*       [3.1 - 3.7]       RIGHT VENTRICLE       ST Junction:  nm*       [2.6 - 3.2]                RV Base:  nm*       [ < 4.2]         Asc.Aorta:  nm*       [2.6 - 3.4]                 RV Mid:  nm*       [ < 3.5]  LEFT VENTRICLE                                        RV Length:  nm*       [ < 8.6]             LVIDd:  5.3 cm    [4.2 - 5.9]       INFERIOR VENA CAVA             LVIDs:  2.8 cm                              Max. IVC:  nm*       [ <= 2.1]                FS:  46.7 %    [> 25]                    Min. IVC:  nm*               SWT:  nm*       [0.6 - 1.0]                   ------------------               PWT:  nm*       [0.6 - 1.0]                   nm* - not measured  LEFT ATRIUM           LA Diam:  nm*       [3.0 - 4.0]       LA A4C Area:  nm*       [ < 20]         LA Volume:  nm*       [18 - 58]   ___________________________________________________________________________________________ INTERPRETATION Normal Stress Echocardiogram NORMAL RIGHT  VENTRICULAR SYSTOLIC FUNCTION MILD VALVULAR REGURGITATION (See above) NO VALVULAR STENOSIS NOTED Ascending proximal aorta 4.6cm   ___________________________________________________________________________________________ Electronically signed by: Danella Penton, MD on 08/29/2016 01:37 PM             Performed By: Rayetta Humphrey, RCS       Ordering Physician: Daniel Nones     Recent Lab Findings: No results found for: WBC, HGB, HCT, PLT, GLUCOSE, CHOL, TRIG, HDL, LDLDIRECT, LDLCALC, ALT, AST, NA, K, CL, CREATININE, BUN, CO2, TSH, INR, GLUF, HGBA1C  Aortic Size Index=     4.6     /Body surface area is 2.27 meters squared. 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

## 2019-06-16 ENCOUNTER — Other Ambulatory Visit: Payer: Self-pay

## 2019-06-16 ENCOUNTER — Ambulatory Visit
Admission: RE | Admit: 2019-06-16 | Discharge: 2019-06-16 | Disposition: A | Discharge status: Home / Self Care | Payer: BC Managed Care – PPO | Source: Ambulatory Visit | Attending: Cardiothoracic Surgery | Admitting: Cardiothoracic Surgery

## 2019-06-16 ENCOUNTER — Ambulatory Visit (INDEPENDENT_AMBULATORY_CARE_PROVIDER_SITE_OTHER): Payer: BC Managed Care – PPO | Admitting: Cardiothoracic Surgery

## 2019-06-16 VITALS — BP 140/74 | HR 48 | Temp 97.9°F | Resp 20 | Ht 66.0 in | Wt 243.0 lb

## 2019-06-16 DIAGNOSIS — I712 Thoracic aortic aneurysm, without rupture, unspecified: Secondary | ICD-10-CM

## 2019-06-16 MED ORDER — IOPAMIDOL (ISOVUE-370) INJECTION 76%
75.0000 mL | Freq: Once | INTRAVENOUS | Status: AC | PRN
  Administered 2019-06-16: 75 mL via INTRAVENOUS

## 2019-06-16 NOTE — Progress Notes (Signed)
[address]       [place] [id number]             [phone]                    [name] [place] Medical Record #[id number] Date of Birth: [birth year]  Referring: Lynnea FerrierKlein, Bert J III, MD Primary Care: Lynnea FerrierKlein, Bert J III, MD Primary cardiologist: Dr. Lady GaryFath  Chief Complaint:    Chief Complaint  Patient presents with  . Thoracic Aortic Aneurysm    1 year f/u with CTA Chest    History of Present Illness:    Joseph Hatfield 59 y.o. male is seen in the office today in follow-up of CT scan for evaluation of dilated ascending aorta.  Patient denies any anginal chest, shortness of breath does have mild pedal edema at times   The patient has no family history of aortic aneurysm or aortic dissection, however it should be noted that his father died suddenly of unknown cause at age 57 in [id number], his mother died of COPD in [address]2015 at age of 57 he has a brother and sister with no medical problems 1 uncles had myocardial infarction.    Current Activity/ Functional Status:  Patient is independent with mobility/ambulation, transfers, ADL's, IADL's.   Zubrod Score: At the time of surgery this patient's most appropriate activity status/level should be described as: []     0    Normal activity, no symptoms [x]     1    Restricted in physical strenuous activity but ambulatory, able to do out light work []     2    Ambulatory and capable of self care, unable to do work activities, up and about               >50 % of waking hours                              []     3    Only limited self care, in bed greater than 50% of waking hours []     4    Completely disabled, no self care, confined to bed or chair []     5    Moribund   Past Medical History:  Diagnosis Date  . Allergic rhinitis   . Anxiety   . Depression   . Dilated aortic root (HCC) 10/08/2016   4.5 cm CTA Chest 09/11/2016  . GERD (gastroesophageal reflux disease)   . Hypertension   . Obesity   . Osteoarthritis    knees   . Sleep apnea    on CPAP    Past Surgical History:  Procedure Laterality Date  . COLONOSCOPY     adenomatous polyps: CBF 12/2015  . COLONOSCOPY WITH PROPOFOL N/A 12/01/2016   Procedure: COLONOSCOPY WITH PROPOFOL;  Surgeon: Scot JunElliott, Robert T, MD;  [facility];  Service: Endoscopy;  Laterality: N/A;  . ESOPHAGOGASTRODUODENOSCOPY (EGD) WITH PROPOFOL N/A 12/01/2016   Procedure: ESOPHAGOGASTRODUODENOSCOPY (EGD) WITH PROPOFOL;  Surgeon: Scot JunElliott, Robert T, MD;  [facility];  Service: Endoscopy;  Laterality: N/A;  . HERNIA REPAIR     left inguinal ; Dr Michela PitcherEly 04/2003    Family History  Problem Relation Age of Onset  . GER disease Mother   . Colon polyps Mother   . Hypertension Father   . Heart attack Father     Social History  Tobacco Use  . Smoking status: Former Smoker    Types: Cigarettes  . Smokeless tobacco: Former Neurosurgeon    Types: Chew  Substance and Sexual Activity  . Alcohol use: Yes  . Drug use: No     Social History   Tobacco Use  Smoking Status Former Smoker  . Types: Cigarettes  Smokeless Tobacco Former Neurosurgeon  . Types: Chew    Social History   Substance and Sexual Activity  Alcohol Use Yes     Allergies  Allergen Reactions  . Ciprofloxacin Other (See Comments)    Contraindicated with AAA  . Quinolones     Current Outpatient Medications  Medication Sig Dispense Refill  . atorvastatin (LIPITOR) 10 MG tablet Take 10 mg by mouth daily.    . busPIRone (BUSPAR) 15 MG tablet Take 1/2-1 tablet by mouth twice a day as directed for anxiety    . citalopram (CELEXA) 40 MG tablet Take 1 tablet by mouth once a day    . metoprolol succinate (TOPROL-XL) 25 MG 24 hr tablet Take by mouth daily.     . pantoprazole (PROTONIX) 40 MG tablet Take by mouth 2 (two) times daily.      No current facility-administered medications for this visit.    Pertinent items are noted in HPI.   Review of Systems:  Review of Systems  All other systems  reviewed and are negative. Patient does have sleep apnea and uses CPAP at night  Immunizations: Flu up to date [y]; Pneumococcal up to date [ y ]; Other:  Physical Exam: BP 140/74   Pulse (!) 48   Temp 97.9 F (36.6 C) (Skin)   Resp 20   Ht 5\' 6"  (1.676 m)   Wt 243 lb (110.2 kg)   SpO2 95% Comment: RA  BMI 39.22 kg/m   PHYSICAL EXAMINATION: General appearance: alert, cooperative and no distress Head: Normocephalic, without obvious abnormality, atraumatic Neck: no adenopathy, no carotid bruit, no JVD, supple, symmetrical, trachea midline and thyroid not enlarged, symmetric, no tenderness/mass/nodules Lymph nodes: Cervical, supraclavicular, and axillary nodes normal. Resp: clear to auscultation bilaterally Cardio: regular rate and rhythm, S1, S2 normal, no murmur, click, rub or gallop GI: soft, non-tender; bowel sounds normal; no masses,  no organomegaly Extremities: extremities normal, atraumatic, no cyanosis or edema Neurologic: Grossly normal  Diagnostic Studies & Laboratory data:     Recent Radiology Findings:  CT ANGIO CHEST AORTA W/CM & OR WO/CM  Result Date: 06/16/2019 CLINICAL DATA:  Thoracic aortic prominence EXAM: CT ANGIOGRAPHY CHEST WITH CONTRAST TECHNIQUE: Multidetector CT imaging of the chest was performed using the standard protocol during bolus administration of intravenous contrast. Multiplanar CT image reconstructions and MIPs were obtained to evaluate the vascular anatomy. CONTRAST:  60mL ISOVUE-370 IOPAMIDOL (ISOVUE-370) INJECTION 76% COMPARISON:  Jun 10, 2018. FINDINGS: Cardiovascular: Ascending thoracic aortic diameter measures 4.5 x 4.5 cm in transverse diameter, stable. Transverse diameter at the sinuses of Valsalva measures 4.5 cm. The measured diameter in the anterior aortic arch region measures 3.3 cm, essentially stable allowing for marginal difference in scan plane. The measured diameter of the descending at the main pulmonary outflow tract level measures  2.6 x 2.6 cm, stable. There is no evident thoracic aortic dissection. Visualized great vessels appear unremarkable. No major vessel pulmonary embolus. There are foci of coronary artery calcification. No pericardial effusion or pericardial thickening. Aorta Mediastinum/Nodes: Thyroid appears normal. There is no appreciable thoracic adenopathy. No esophageal lesions are evident. Lungs/Pleura: There is slight atelectatic change in the bases.  Lungs elsewhere clear. No edema or airspace opacity. No pleural effusions are evident. Upper Abdomen: There is apparent degree of hepatic steatosis. Visualized upper abdominal structures otherwise appear unremarkable. Musculoskeletal: No blastic or lytic bone lesions. No evident chest wall lesions. Review of the MIP images confirms the above findings. IMPRESSION: 1. Essentially stable appearance of the thoracic aorta. Ascending thoracic aortic diameter is stable 4.5 x 4.5 cm. No evident dissection. Multiple foci of coronary artery calcification again noted. Ascending thoracic aortic aneurysm. Recommend semi-annual imaging followup by CTA or MRA and referral to cardiothoracic surgery if not already obtained. This recommendation follows 2010 ACCF/AHA/AATS/ACR/ASA/SCA/SCAI/SIR/STS/SVM Guidelines for the Diagnosis and Management of Patients With Thoracic Aortic Disease. Circulation. 2010; 121: O122-Q825. Aortic aneurysm NOS (ICD10-I71.9). 2.  No evident pulmonary embolus. 3.  Minimal bibasilar atelectasis.  Lungs otherwise clear. 4.  No evident thoracic adenopathy. 5.  Hepatic steatosis. Electronically Signed   By: Bretta Bang III M.D.   On: 06/16/2019 11:22   I have independently reviewed the above radiology studies  and reviewed the findings with the patient.   Ct Angio Chest Aorta W &/or Wo Contrast  Result Date: 06/10/2018 CLINICAL DATA:  Thoracic aortic aneurysm EXAM: CT ANGIOGRAPHY CHEST WITH CONTRAST TECHNIQUE: Multidetector CT imaging of the chest was performed  using the standard protocol during bolus administration of intravenous contrast. Multiplanar CT image reconstructions and MIPs were obtained to evaluate the vascular anatomy. CONTRAST:  78mL ISOVUE-370 IOPAMIDOL (ISOVUE-370) INJECTION 76% Creatinine 0.9; GFR 95 COMPARISON:  April 16, 2017 FINDINGS: Cardiovascular: Ascending thoracic aorta measures 4.5 x 4.5 cm in transverse diameter, stable. On the coronal oblique views, the measured diameter at the sinotubular junction measures 4.5 cm. The measured diameter along the anterior aspect of the aortic arch measures 3.2 cm. The mid descending thoracic aorta measures 2.6 x 2.6 cm. There is no thoracic aortic dissection. Visualized great vessels appear unremarkable. No demonstrable pulmonary embolus. No thoracic aortic aneurysm or dissection. There are foci of coronary artery calcification. Mediastinum/Nodes: Thyroid appears normal. There is no appreciable thoracic adenopathy. No esophageal lesions are evident. Lungs/Pleura: There is slight bibasilar atelectasis. There is no parenchymal lung edema or consolidation. No pleural effusion or pleural thickening. Upper Abdomen: There is hepatic steatosis. Visualized upper abdominal structures otherwise appear unremarkable. Musculoskeletal: There are no blastic or lytic bone lesions. No chest wall lesions are evident. Review of the MIP images confirms the above findings. IMPRESSION: 1. Stable ascending thoracic aortic prominence with a measured transverse diameter of 4.5 x 4.5 cm. No thoracic aortic dissection. Ascending thoracic aortic aneurysm. Recommend semi-annual imaging followup by CTA or MRA and referral to cardiothoracic surgery if not already obtained. This recommendation follows 2010 ACCF/AHA/AATS/ACR/ASA/SCA/SCAI/SIR/STS/SVM Guidelines for the Diagnosis and Management of Patients With Thoracic Aortic Disease. Circulation. 2010; 121: O037-C488. Aortic aneurysm NOS (ICD10-I71.9). 2.  There are foci of coronary artery  calcification. 3.  Areas of slight atelectatic change.  No edema or consolidation. 4.  No evident pulmonary embolus. 5.  No demonstrable thoracic adenopathy. 6.  Hepatic steatosis. Electronically Signed   By: Bretta Bang III M.D.   On: 06/10/2018 10:35   Ct Angio Chest/abd/pel For Dissection W And/or W/wo  Result Date: 09/11/2016 CLINICAL DATA:  Dilated aortic root. EXAM: CT ANGIOGRAPHY CHEST, ABDOMEN AND PELVIS TECHNIQUE: Multidetector CT imaging through the chest, abdomen and pelvis was performed using the standard protocol during bolus administration of intravenous contrast. Multiplanar reconstructed images and MIPs were obtained and reviewed to evaluate the vascular anatomy. CONTRAST:  100 mL  Isovue 370 COMPARISON:  None. FINDINGS: CTA CHEST FINDINGS Cardiovascular: Aortic root at the sinuses of Valsalva measures up to 4.6 cm. The mid ascending thoracic aorta measures 4.5 cm. Proximal aortic arch measures 3.7 cm. Great vessels are patent. Posterior aortic arch measures 3.0 cm. Proximal descending thoracic aorta measures 2.7 cm. The mid descending thoracic aorta measures 2.8 cm. Distal descending thoracic aorta measures 2.5 cm. Negative for an aortic dissection. No significant atherosclerotic disease in the thoracic aorta or great vessels. There are coronary artery calcifications. Heart size is within limits. Main pulmonary arteries are patent. Mediastinum/Nodes: No significant mediastinal or hilar lymphadenopathy. Visualized thyroid tissue is unremarkable. Normal appearance of the esophagus. Lungs/Pleura: Trachea and mainstem bronchi are patent. 2 mm pleural-based nodular density in the left lower lobe on sequence 9 image 42. No significant airspace disease or lung consolidation. No pleural effusions. Musculoskeletal: No acute bone abnormality. Review of the MIP images confirms the above findings. CTA ABDOMEN AND PELVIS FINDINGS VASCULAR Aorta: Normal caliber of the abdominal aorta with mild  atherosclerotic disease in the infrarenal abdominal aorta. Negative for an aortic dissection. Celiac: Patent without evidence of aneurysm, dissection, vasculitis or significant stenosis. SMA: Patent without evidence of aneurysm, dissection, vasculitis or significant stenosis. Incidentally, there is a replaced right hepatic artery which is a normal variant. Renals: Both renal arteries are patent without evidence of aneurysm, dissection, vasculitis, fibromuscular dysplasia or significant stenosis. IMA: Patent without evidence of aneurysm, dissection, vasculitis or significant stenosis. Inflow: Patent without evidence of aneurysm, dissection, vasculitis or significant stenosis. Veins: No obvious venous abnormality within the limitations of this arterial phase study. Review of the MIP images confirms the above findings. NON-VASCULAR Hepatobiliary: Subtle low-density structure along the anterior right hepatic lobe is too small to definitively characterize. Normal appearance of the gallbladder. No biliary dilatation. Pancreas: Normal appearance of the pancreas without inflammation or duct dilatation. Spleen: Normal appearance of spleen without enlargement. Adrenals/Urinary Tract: Normal adrenal glands. Probable 0.6 cm cyst in the posterolateral right kidney. No hydronephrosis. Urinary bladder is unremarkable. Stomach/Bowel: Colonic diverticulosis in the sigmoid colon without acute inflammatory changes. Appendix is normal. No evidence for bowel dilatation or focal bowel wall thickening. Normal appearance stomach and duodenum. Lymphatic: No enlarged abdominal or pelvic lymph nodes. Reproductive: Calcifications in prostate. Other: Changes compatible with left inguinal hernia surgery. No free fluid. No free air. Small umbilical hernia containing fat. Musculoskeletal: Disc space loss at L5-S1 with facet disease at L5-S1. Review of the MIP images confirms the above findings. IMPRESSION: Aneurysm of the aortic root and ascending  thoracic aorta. The ascending thoracic aorta measures up to 4.5 cm. Recommend semi-annual imaging followup by CTA or MRA and referral to cardiothoracic surgery if not already obtained. This recommendation follows 2010 ACCF/AHA/AATS/ACR/ASA/SCA/SCAI/SIR/STS/SVM Guidelines for the Diagnosis and Management of Patients With Thoracic Aortic Disease. Circulation. 2010; 121: C623-J628 Coronary artery calcifications. Punctate pleural-based nodule in the left lower lobe is nonspecific and probably an incidental finding. This could be related to atelectasis or scarring. No follow-up needed if patient is low-risk (and has no known or suspected primary neoplasm). Non-contrast chest CT can be considered in 12 months if patient is high-risk. This recommendation follows the consensus statement: Guidelines for Management of Incidental Pulmonary Nodules Detected on CT Images: From the Fleischner Society 2017; Radiology 2017; 284:228-243. No acute abnormality in the chest, abdomen or pelvis. Colonic diverticulosis without acute inflammation. Electronically Signed   By: Markus Daft M.D.   On: 09/11/2016 15:49   US Abdomen Limited Ruq  Result Date: 09/16/2016 CLINICAL DATA:  Postprandial right upper quadrant pain EXAM: ULTRASOUND ABDOMEN LIMITED RIGHT UPPER QUADRANT COMPARISON:  09/11/2016 FINDINGS: Gallbladder: No gallstones or wall thickening visualized. No sonographic Murphy sign noted by sonographer. Common bile duct: Diameter: 2.6 mm Liver: Diffuse increased echogenicity compatible with hepatic steatosis. No focal hepatic abnormality or intrahepatic biliary dilatation. Portal vein is patent on color Doppler imaging with normal direction of blood flow towards the liver. IMPRESSION: Negative for gallstones or biliary dilatation. Hepatic steatosis Electronically Signed   By: Judie Petit.  Shick M.D.   On: 09/16/2016 13:39     I have independently reviewed the above radiology studies  and reviewed the findings with the  patient. ECHO: CARDIOLOGY DEPARTMENT         [facility]                  [id number]      [facility]              Acct #: [id number]      [address] [place], [place] [address]    Date: 02/03/2018 01: 02 PM                                Adult  Male Age: 77 yrs      ECHOCARDIOGRAM REPORT               Outpatient                                [facility]    STUDY:CHEST WALL        TAPE:          MD1: FATH, KENNETH ALAN    ECHO:Yes  DOPPLER:Yes    FILE:          BP: 144/78 mmHg    COLOR:Yes  CONTRAST:No   MACHINE:Philips  RV BIOPSY:No     3D:No SOUND QLTY:Moderate      Height: 67 in   MEDIUM:None                       Weight: 250 lb                                BSA: 2.2 m2 _________________________________________________________________________________________        HISTORY: DOE         REASON: Assess, LV function       INDICATION: Thoracic ascending aortic aneurysm (CMS-HCC) [I71.2 (ICD-10-CM)] _________________________________________________________________________________________ ECHOCARDIOGRAPHIC MEASUREMENTS 2D DIMENSIONS AORTA         Values  Normal Range  MAIN PA     Values  Normal Range        Annulus: 2.2 cm    [2.3-2.9]     PA Main: nm*    [1.5-2.1]       Aorta Sin: 4.3 cm    [3.1-3.7]  RIGHT VENTRICLE      ST Junction: nm*     [2.6-3.2]     RV Base: nm*    [<4.2]       Asc.Aorta: nm*     [2.6-3.4]     RV Mid: 3.7 cm  [< 3.5] LEFT VENTRICLE  RV Length: nm*    [<8.6]         LVIDd: 5.4 cm    [4.2-5.9]  INFERIOR VENA CAVA         LVIDs:  3.6 cm            Max. IVC: nm*    [<=2.1]           FS: 32.6 %    [>25]      Min. IVC: nm*          SWT: 1.5 cm    [0.6-1.0]  ------------------          PWT: 1.2 cm    [0.6-1.0]  nm* - not measured LEFT ATRIUM        LA Diam: 4.3 cm    [3.0-4.0]      LA A4C Area: nm*     [<20]       LA Volume: nm*     [18-58] _________________________________________________________________________________________ ECHOCARDIOGRAPHIC DESCRIPTIONS AORTIC ROOT          Size: MILDLY DILATED       Dissection: INDETERM FOR DISSECTION AORTIC VALVE        Leaflets: Tricuspid          Morphology: Normal        Mobility: Fully mobile LEFT VENTRICLE          Size: Normal            Anterior: Normal      Contraction: Normal             Lateral: Normal       Closest EF: >55% (Estimated)        Septal: Normal       LV Masses: No Masses            Apical: Normal          LVH: MODERATE LVH         Inferior: Normal                           Posterior: Normal      Dias.FxClass: N/A MITRAL VALVE        Leaflets: Normal            Mobility: Fully mobile       Morphology: Normal LEFT ATRIUM          Size: MILDLY ENLARGED       LA Masses: No masses       IA Septum: Normal IAS MAIN PA          Size: Normal PULMONIC VALVE       Morphology: Normal            Mobility: Fully mobile RIGHT VENTRICLE       RV Masses: No Masses             Size: MILDLY ENLARGED       Free Wall: Normal           Contraction: Normal TRICUSPID VALVE        Leaflets: Normal            Mobility: Fully mobile       Morphology: Normal RIGHT ATRIUM          Size: Normal             RA Other: None        RA Mass: No masses PERICARDIUM  Fluid: No effusion INFERIOR VENACAVA          Size: Not seen Not Seen _________________________________________________________________________________________  DOPPLER ECHO and OTHER SPECIAL PROCEDURES         Aortic: MODERATE AR        No AS             158.7 cm/sec peak vel   10.1 mmHg peak grad         Mitral: MILD MR          No MS             2.4 cm^2 by DOPPLER             MV Inflow E Vel = 86.8 cm/sec   MV Annulus E'Vel = 5.9 cm/sec             E/E'Ratio = 14.7       Tricuspid: MILD TR          No TS             267.4 cm/sec peak TR vel  33.6 mmHg peak RV pressure       Pulmonary: MILD PR          No PS             120.8 cm/sec peak vel   5.8 mmHg peak grad _________________________________________________________________________________________ INTERPRETATION NORMAL LEFT VENTRICULAR SYSTOLIC FUNCTION  WITH MODERATE LVH NORMAL RIGHT VENTRICULAR SYSTOLIC FUNCTION MODERATE VALVULAR REGURGITATION (See above) NO VALVULAR STENOSIS MODERATE AR MILD MR, TR, PR MILDLY DILATED Ao ROOT EF 55% Closest EF: >55% (Estimated) LVH: MODERATE LVH Aortic: MODERATE AR Mitral: MILD MR Tricuspid: MILD TR Size: MILDLY DILATED _________________________________________________________________________________________ Electronically signed by      MD Mariel Kansky on 02/04/2018 09: 40 AM      Performed By: Merla Riches, RDCS   Ordering Physician: Harold Hedge ________________________________________________________________    STRESS ECHO:  INTERNAL MEDICINE DEPARTMENT [place], [facility] [address] [id number] [facility] Acct #:  [id number]  [address] [place] [address] Date: 08/28/2016 03:16 PM  Adult Male Age: 59 yrs  ECHOCARDIOGRAM REPORT Outpatient STUDY:Stress EchoTAPE: KC::[id number]  ECHO:Yes DOPPLER:YesFILE:0000-000-000 MD1:KLEIN, BERT JACK COLOR:YesCONTRAST:NoMACHINE:PhilipsHeight: 66 in RV BIOPSY:No 3D:No SOUND QLTY:Moderate Weight: 121 lb  MEDIUM:None BSA: 1.6 m2  ___________________________________________________________________________________________  HISTORY:Chest pain REASON:Assess, LV function Indication:Chest pain at rest, unspecified [R07.9 (ICD-10-CM)]  ___________________________________________________________________________________________ STRESS ECHOCARDIOGRAPHY   Protocol:Treadmill (Bruce) Drugs:None Target Heart Rate:141 bpmMaximum Predicted Heart Rate: 166 bpm  +-------------------+-------------------------+-------------------------+------------+  Stage  Duration (mm:ss) Heart Rate (bpm) BP  +-------------------+-------------------------+-------------------------+------------+ RESTING  136  122/78  +-------------------+-------------------------+-------------------------+------------+ EXERCISE  4:39 136  / +-------------------+-------------------------+-------------------------+------------+ RECOVERY  3:4382  181/90  +-------------------+-------------------------+-------------------------+------------+  Stress Duration:4:39 mm:ss Max Stress  H.R.:136 bpmTarget Heart Rate Achieved: No Maximum workload of 7.00 METS was achieved during exercise  ___________________________________________________________________________________________  WALL SEGMENT CHANGES  RestStress Anterior Septum Basal:NormalHyperkinetic YNW:[id number]  Apical:NormalHyperkinetic  Anterior Wall Basal:NormalHyperkinetic XBM:[id number]  Apical:NormalHyperkinetic   Lateral Wall Basal:NormalHyperkinetic QIH:[id number]  Apical:NormalHyperkinetic   Posterior Wall Basal:NormalHyperkinetic JJO:[id number]  Inferior Wall Basal:NormalHyperkinetic FTD:[id number]  Apical:NormalHyperkinetic  Inferior Septum Basal:NormalHyperkinetic TDV:[id number]   Resting EF:>55% (Est.) Stress EF: >55% (Est.)  ___________________________________________________________________________________________  ADDITIONAL FINDINGS   ___________________________________________________________________________________________  STRESS ECG RESULTS   ECG Results:Normal  ___________________________________________________________________________________________ ECHOCARDIOGRAPHIC DESCRIPTIONS  LEFT VENTRICLE Size:Normal  Contraction:Normal  LV Masses:No Masses  OEV:OJJK Dias.FxClass:Normal  RIGHT VENTRICLE Size:Normal Free Wall:Normal  Contraction:Normal RV Masses:No mass  PERICARDIUM  Fluid:No  effusion   _______________________________________________________________________________________ DOPPLER ECHO and OTHER SPECIAL PROCEDURES  Aortic:MILD ARNo AS  137.3 cm/sec peak vel7.5 mmHg peak grad 5.1 mmHg mean grad 2.3 cm^2 by DOPPLER   Mitral:MILD MRNo MS MV Inflow E Vel=nm*MV Annulus E'Vel=nm* E/E'Ratio=nm*  Tricuspid:MILD TRNo TS 259.7 cm/sec peak TR vel  Pulmonary:MILD PRNo PS    ___________________________________________________________________________________________ ECHOCARDIOGRAPHIC MEASUREMENTS 2D DIMENSIONS AORTA ValuesNormal RangeMAIN PAValuesNormal Range Annulus:nm* [2.3 - 2.9]PA Main:nm* [1.5 - 2.1] Aorta Sin:nm* [3.1 - 3.7] RIGHT VENTRICLE ST Junction:nm* [2.6 - 3.2]RV Base:nm* [ < 4.2] Asc.Aorta:nm* [2.6 - 3.4] RV Mid:nm* [ < 3.5]  LEFT VENTRICLERV Length:nm* [ < 8.6] LVIDd:5.3 cm[4.2 - 5.9] INFERIOR VENA CAVA LVIDs:2.8 cmMax. IVC:nm* [ <= 2.1]  FS:46.7 %[> 25]Min. IVC:nm* SWT:nm* [0.6 - 1.0] ------------------ PWT:nm* [0.6 - 1.0] nm* - not measured  LEFT ATRIUM LA Diam:nm* [3.0 - 4.0] LA A4C Area:nm* [ < 20] LA Volume:nm* [18 - 58]   ___________________________________________________________________________________________ INTERPRETATION Normal Stress Echocardiogram NORMAL RIGHT VENTRICULAR SYSTOLIC FUNCTION MILD VALVULAR  REGURGITATION (See above) NO VALVULAR STENOSIS NOTED Ascending proximal aorta 4.6cm   ___________________________________________________________________________________________ Electronically signed by: Danella Penton, MD on 08/29/2016 01:37 PM Performed By: Rayetta Humphrey, RCS Ordering Physician: Daniel Nones  ___________________________________________________________________________________________  Other Result Information  Interface, Text Results In - 08/29/2016  1:38 PM EDT       Version 2                 INTERNAL MEDICINE DEPARTMENT               FINDLEY, VI                       [facility]                      [id number]                   [facility]                 Acct #: [id number]        [address] [place],  [place] [address]       Date: 08/28/2016 03:16 PM                                                            Adult Male Age: 22 yrs                    ECHOCARDIOGRAM REPORT                   Outpatient           STUDY:Stress Echo        TAPE:                   [id number]::[id number]            ECHO:Yes   DOPPLER:Yes  FILE:[id number]       MD1:  Teena Dunk           COLOR:Yes  CONTRAST:No      MACHINE:Philips      Height: 66 in       RV BIOPSY:No         3D:No   SOUND QLTY:Moderate     Weight: 121 lb  MEDIUM:None                                       BSA: 1.6 m2  ___________________________________________________________________________________________        HISTORY:Chest pain         REASON:Assess, LV function     Indication:Chest pain at rest, unspecified [R07.9 (ICD-10-CM)]  ___________________________________________________________________________________________ STRESS ECHOCARDIOGRAPHY           Protocol:Treadmill (Bruce)             Drugs:None Target Heart Rate:141 bpm        Maximum Predicted Heart Rate: 166 bpm  +-------------------+-------------------------+-------------------------+------------+         Stage            Duration (mm:ss)         Heart Rate (bpm)         BP      +-------------------+-------------------------+-------------------------+------------+       RESTING                                          136              122/78    +-------------------+-------------------------+-------------------------+------------+       EXERCISE                4:39                     136                /       +-------------------+-------------------------+-------------------------+------------+       RECOVERY                3:43                      82              181/90    +-------------------+-------------------------+-------------------------+------------+    Stress Duration:4:39 mm:ss   Max Stress H.R.:136 bpm        Target Heart Rate Achieved: No Maximum workload of 7.00 METS was achieved during exercise  ___________________________________________________________________________________________  WALL SEGMENT CHANGES                        Rest          Stress Anterior Septum Basal:Normal        Hyperkinetic                   ZOX:WRUEAV        Hyperkinetic                Apical:Normal        Hyperkinetic    Anterior Wall Basal:Normal        Hyperkinetic                   WUJ:WJXBJY        Hyperkinetic                Apical:Normal        Hyperkinetic     Lateral Wall Basal:Normal        Hyperkinetic  ZOX:WRUEAV        Hyperkinetic                Apical:Normal        Hyperkinetic   Posterior Wall Basal:Normal        Hyperkinetic                   WUJ:WJXBJY        Hyperkinetic    Inferior Wall Basal:Normal        Hyperkinetic                   NWG:NFAOZH        Hyperkinetic                Apical:Normal        Hyperkinetic  Inferior Septum Basal:Normal        Hyperkinetic                   YQM:VHQION        Hyperkinetic             Resting EF:>55% (Est.)   Stress EF: >55%  (Est.)  ___________________________________________________________________________________________  ADDITIONAL FINDINGS   ___________________________________________________________________________________________  STRESS ECG RESULTS                                               ECG Results:Normal  ___________________________________________________________________________________________ ECHOCARDIOGRAPHIC DESCRIPTIONS  LEFT VENTRICLE         Size:Normal  Contraction:Normal    LV Masses:No Masses          GEX:BMWU Dias.FxClass:Normal  RIGHT VENTRICLE         Size:Normal               Free Wall:Normal  Contraction:Normal               RV Masses:No mass  PERICARDIUM        Fluid:No effusion   _______________________________________________________________________________________ DOPPLER ECHO and OTHER SPECIAL PROCEDURES    Aortic:MILD AR                    No AS           137.3 cm/sec peak vel      7.5 mmHg peak grad           5.1 mmHg mean grad         2.3 cm^2 by DOPPLER     Mitral:MILD MR                    No MS           MV Inflow E Vel=nm*        MV Annulus E'Vel=nm*           E/E'Ratio=nm*  Tricuspid:MILD TR                    No TS           259.7 cm/sec peak TR vel  Pulmonary:MILD PR                    No PS    ___________________________________________________________________________________________ ECHOCARDIOGRAPHIC MEASUREMENTS 2D DIMENSIONS AORTA             Values      Normal Range      MAIN PA  Values      Normal Range           Annulus:  nm*       [2.3 - 2.9]                PA Main:  nm*       [1.5 - 2.1]         Aorta Sin:  nm*       [3.1 - 3.7]       RIGHT VENTRICLE       ST Junction:  nm*       [2.6 - 3.2]                RV Base:  nm*       [ < 4.2]         Asc.Aorta:  nm*       [2.6 - 3.4]                 RV Mid:  nm*       [ < 3.5]  LEFT VENTRICLE                                        RV Length:  nm*       [ < 8.6]              LVIDd:  5.3 cm    [4.2 - 5.9]       INFERIOR VENA CAVA             LVIDs:  2.8 cm                              Max. IVC:  nm*       [ <= 2.1]                FS:  46.7 %    [> 25]                    Min. IVC:  nm*               SWT:  nm*       [0.6 - 1.0]                   ------------------               PWT:  nm*       [0.6 - 1.0]                   nm* - not measured  LEFT ATRIUM           LA Diam:  nm*       [3.0 - 4.0]       LA A4C Area:  nm*       [ < 20]         LA Volume:  nm*       [18 - 58]   ___________________________________________________________________________________________ INTERPRETATION Normal Stress Echocardiogram NORMAL RIGHT VENTRICULAR SYSTOLIC FUNCTION MILD VALVULAR REGURGITATION (See above) NO VALVULAR STENOSIS NOTED Ascending proximal aorta 4.6cm   ___________________________________________________________________________________________ Electronically signed by: Danella Penton, MD on 08/29/2016 01:37 PM             Performed By: Rayetta Humphrey, RCS       Ordering Physician:  Graciela Husbands, BERT     Recent Lab Findings: No results found for: WBC, HGB, HCT, PLT, GLUCOSE, CHOL, TRIG, HDL, LDLDIRECT, LDLCALC, ALT, AST, NA, K, CL, CREATININE, BUN, CO2, TSH, INR, GLUF, HGBA1C  Aortic Size Index=     4.6     /Body surface area is 2.27 meters squared. 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 Signs and symptoms of aortic dissection were discussed with him. Plan repeat CTA of the chest in 1 year.     Delight Ovens MD      [address] [place] ,[place] [address] Office [phone]   Beeper [phone]  06/16/2019 2:05 PM

## 2020-05-01 ENCOUNTER — Other Ambulatory Visit: Payer: Self-pay | Admitting: Surgery

## 2020-05-01 DIAGNOSIS — I712 Thoracic aortic aneurysm, without rupture, unspecified: Secondary | ICD-10-CM

## 2020-05-01 DIAGNOSIS — I77810 Thoracic aortic ectasia: Secondary | ICD-10-CM

## 2020-05-30 ENCOUNTER — Ambulatory Visit: Payer: BC Managed Care – PPO | Admitting: Surgery

## 2020-05-30 ENCOUNTER — Other Ambulatory Visit: Payer: BC Managed Care – PPO

## 2020-09-21 ENCOUNTER — Other Ambulatory Visit: Payer: Self-pay | Admitting: Internal Medicine

## 2020-09-21 DIAGNOSIS — I77810 Thoracic aortic ectasia: Secondary | ICD-10-CM

## 2020-09-26 ENCOUNTER — Other Ambulatory Visit: Payer: Self-pay

## 2020-09-26 ENCOUNTER — Ambulatory Visit
Admission: RE | Admit: 2020-09-26 | Discharge: 2020-09-26 | Disposition: A | Discharge status: Home / Self Care | Payer: BC Managed Care – PPO | Source: Ambulatory Visit | Attending: Internal Medicine | Admitting: Internal Medicine

## 2020-09-26 DIAGNOSIS — I77810 Thoracic aortic ectasia: Secondary | ICD-10-CM | POA: Insufficient documentation

## 2020-09-26 LAB — POCT I-STAT CREATININE: Creatinine, Ser: 0.8 mg/dL (ref 0.61–1.24)

## 2020-09-26 MED ORDER — IOHEXOL 350 MG/ML SOLN
75.0000 mL | Freq: Once | INTRAVENOUS | Status: AC | PRN
  Administered 2020-09-26: 75 mL via INTRAVENOUS

## 2021-05-03 ENCOUNTER — Other Ambulatory Visit: Payer: Self-pay | Admitting: Internal Medicine

## 2021-05-03 DIAGNOSIS — R600 Localized edema: Secondary | ICD-10-CM

## 2021-05-23 ENCOUNTER — Ambulatory Visit
Admission: RE | Admit: 2021-05-23 | Discharge: 2021-05-23 | Disposition: A | Discharge status: Home / Self Care | Payer: BC Managed Care – PPO | Source: Ambulatory Visit | Attending: Internal Medicine | Admitting: Internal Medicine

## 2021-05-23 DIAGNOSIS — R600 Localized edema: Secondary | ICD-10-CM

## 2021-06-03 ENCOUNTER — Other Ambulatory Visit: Payer: Self-pay | Admitting: Physical Medicine and Rehabilitation

## 2021-06-03 DIAGNOSIS — M5416 Radiculopathy, lumbar region: Secondary | ICD-10-CM

## 2021-06-10 ENCOUNTER — Ambulatory Visit
Admission: RE | Admit: 2021-06-10 | Discharge: 2021-06-10 | Disposition: A | Discharge status: Home / Self Care | Payer: BC Managed Care – PPO | Source: Ambulatory Visit | Attending: Physical Medicine and Rehabilitation | Admitting: Physical Medicine and Rehabilitation

## 2021-06-10 DIAGNOSIS — M5416 Radiculopathy, lumbar region: Secondary | ICD-10-CM

## 2021-08-12 ENCOUNTER — Other Ambulatory Visit: Payer: Self-pay | Admitting: Internal Medicine

## 2021-08-12 DIAGNOSIS — I77810 Thoracic aortic ectasia: Secondary | ICD-10-CM

## 2021-08-12 DIAGNOSIS — Z0000 Encounter for general adult medical examination without abnormal findings: Secondary | ICD-10-CM

## 2021-08-29 ENCOUNTER — Ambulatory Visit
Admission: RE | Admit: 2021-08-29 | Discharge: 2021-08-29 | Disposition: A | Discharge status: Home / Self Care | Payer: BC Managed Care – PPO | Source: Ambulatory Visit | Attending: Internal Medicine | Admitting: Internal Medicine

## 2021-08-29 DIAGNOSIS — I77810 Thoracic aortic ectasia: Secondary | ICD-10-CM

## 2021-08-29 DIAGNOSIS — Z0000 Encounter for general adult medical examination without abnormal findings: Secondary | ICD-10-CM

## 2021-08-29 MED ORDER — IOPAMIDOL (ISOVUE-370) INJECTION 76%
75.0000 mL | Freq: Once | INTRAVENOUS | Status: AC | PRN
  Administered 2021-08-29: 75 mL via INTRAVENOUS

## 2023-09-02 ENCOUNTER — Other Ambulatory Visit: Payer: Self-pay | Admitting: Internal Medicine

## 2023-09-02 DIAGNOSIS — Z0000 Encounter for general adult medical examination without abnormal findings: Secondary | ICD-10-CM

## 2023-09-02 DIAGNOSIS — I7121 Aneurysm of the ascending aorta, without rupture: Secondary | ICD-10-CM

## 2023-09-16 ENCOUNTER — Ambulatory Visit
Admission: RE | Admit: 2023-09-16 | Discharge: 2023-09-16 | Disposition: A | Discharge status: Home / Self Care | Source: Ambulatory Visit | Attending: Internal Medicine | Admitting: Internal Medicine

## 2023-09-16 DIAGNOSIS — I7121 Aneurysm of the ascending aorta, without rupture: Secondary | ICD-10-CM | POA: Insufficient documentation

## 2023-09-16 DIAGNOSIS — Z0000 Encounter for general adult medical examination without abnormal findings: Secondary | ICD-10-CM | POA: Diagnosis present

## 2023-09-16 MED ORDER — IOHEXOL 350 MG/ML SOLN
75.0000 mL | Freq: Once | INTRAVENOUS | Status: AC | PRN
  Administered 2023-09-16: 75 mL via INTRAVENOUS
